# Patient Record
Sex: Female | Born: 1995 | Race: White | Hispanic: No | Marital: Married | Smoking: Never smoker
Health system: Southern US, Community
[De-identification: ages and names within clinical notes are randomized; demographics above are authoritative.]

## PROBLEM LIST (undated history)

## (undated) DIAGNOSIS — C029 Malignant neoplasm of tongue, unspecified: Secondary | ICD-10-CM

## (undated) DIAGNOSIS — Z309 Encounter for contraceptive management, unspecified: Secondary | ICD-10-CM

## (undated) DIAGNOSIS — Z789 Other specified health status: Secondary | ICD-10-CM

## (undated) DIAGNOSIS — B001 Herpesviral vesicular dermatitis: Principal | ICD-10-CM

## (undated) HISTORY — PX: NO PAST SURGERIES: SHX2092

## (undated) HISTORY — DX: Encounter for contraceptive management, unspecified: Z30.9

## (undated) HISTORY — DX: Herpesviral vesicular dermatitis: B00.1

---
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
## 2014-03-29 ENCOUNTER — Other Ambulatory Visit: Payer: Self-pay | Admitting: Adult Health

## 2014-03-29 ENCOUNTER — Telehealth: Payer: Self-pay | Admitting: *Deleted

## 2014-03-29 NOTE — Telephone Encounter (Signed)
Pt Mom came into office today and stated was told to come into office this am and pick up samples for Minastrin Fe. Gave 1 box of sample of Minastren FE to pt mom. Pt to call office for her P & P.

## 2014-04-21 ENCOUNTER — Encounter (INDEPENDENT_AMBULATORY_CARE_PROVIDER_SITE_OTHER): Payer: Self-pay | Admitting: *Deleted

## 2014-05-26 ENCOUNTER — Ambulatory Visit (INDEPENDENT_AMBULATORY_CARE_PROVIDER_SITE_OTHER): Payer: Self-pay | Admitting: Internal Medicine

## 2014-05-27 ENCOUNTER — Ambulatory Visit (INDEPENDENT_AMBULATORY_CARE_PROVIDER_SITE_OTHER): Payer: 59 | Admitting: Internal Medicine

## 2014-05-27 ENCOUNTER — Encounter (INDEPENDENT_AMBULATORY_CARE_PROVIDER_SITE_OTHER): Payer: Self-pay | Admitting: *Deleted

## 2014-05-27 ENCOUNTER — Encounter (INDEPENDENT_AMBULATORY_CARE_PROVIDER_SITE_OTHER): Payer: Self-pay | Admitting: Internal Medicine

## 2014-05-27 ENCOUNTER — Other Ambulatory Visit (INDEPENDENT_AMBULATORY_CARE_PROVIDER_SITE_OTHER): Payer: Self-pay | Admitting: *Deleted

## 2014-05-27 VITALS — BP 138/62 | HR 84 | Temp 97.4°F | Ht 68.0 in | Wt 150.6 lb

## 2014-05-27 DIAGNOSIS — K625 Hemorrhage of anus and rectum: Secondary | ICD-10-CM

## 2014-05-27 NOTE — Patient Instructions (Signed)
Colonoscopy.  The risks and benefits such as perforation, bleeding, and infection were reviewed with the patient and is agreeable. 

## 2014-05-27 NOTE — Progress Notes (Signed)
   Subjective:    Patient ID: Rebecca Valdez, female    DOB: 12-08-1995, 19 y.o.   MRN: [id number]  HPI Referred to our office by Dr. Wende Neighbors for diarrhea/ blood in stool. She tells me she had blood in her stool over Christmas break. She says she had diarrhea and then she felt constipated. She went back to the [place] and she saw blood. Just one incidence. She has a picture which show blood in the commode..  She also tells me she had diarrhea in July and her stool studies were negative. 10/19/2013 Stool studies in July negative which included C-diff. Stool studies obtained for diarrhea x 1 week. Saw Dr. Nevada Crane for this.  She says now she is okay. She has one stool a day. No blood since December. Appetite is good. No weight loss.  She usually has a BM x 1 a day. No family hx of Crohn's disease or UC    Review of Systems  Mother and father alive in good health One sister in good health.     Past Medical History  Diagnosis Date  . 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 Contraceptive management 05/01/2013    Past Surgical History  Procedure Laterality Date  . No past surgeries      No Known Allergies  Current Outpatient Prescriptions on File Prior to Visit  Medication Sig Dispense Refill  . MICROGESTIN FE 1/20 1-20 MG-MCG tablet TAKE ONE TABLET BY MOUTH ONCE DAILY 28 tablet 6   No current facility-administered medications on file prior to visit.     Objective:   Physical Exam Blood pressure 138/62, pulse 84, temperature 97.4 F (36.3 C), height 5\' 8"  (1.727 m), weight 150 lb 9.6 oz (68.312 kg). Alert and oriented. Skin warm and dry. Oral mucosa is moist.   . Sclera anicteric, conjunctivae is pink. Thyroid not enlarged. No cervical lymphadenopathy. Lungs clear. Heart regular rate and rhythm.  Abdomen is soft. Bowel sounds are positive. No hepatomegaly. No abdominal masses felt. No tenderness.  No edema to lower extremities.          Assessment & Plan:  Rectal bleeding resolved. I  discussed with Dr. Laural Golden. ? Colitis. Sigmoidoscopy with sedation.

## 2014-05-28 ENCOUNTER — Encounter (INDEPENDENT_AMBULATORY_CARE_PROVIDER_SITE_OTHER): Payer: Self-pay

## 2014-05-31 ENCOUNTER — Telehealth (INDEPENDENT_AMBULATORY_CARE_PROVIDER_SITE_OTHER): Payer: Self-pay | Admitting: *Deleted

## 2014-05-31 NOTE — Telephone Encounter (Signed)
Spoke to patient's mother and they have decided to cancel Flex Sig and wait until she has another episode of bleeding, I explained to Ms Rebecca Valdez that there wasn't a guarantee that she could get on for the FS while she was having the bleeding and mom stated Keiran had only had 2 episodes within a year

## 2014-05-31 NOTE — Telephone Encounter (Signed)
noted 

## 2014-06-11 ENCOUNTER — Encounter (HOSPITAL_COMMUNITY): Admission: RE | Payer: Self-pay | Source: Ambulatory Visit

## 2014-06-11 ENCOUNTER — Ambulatory Visit (HOSPITAL_COMMUNITY): Admission: RE | Admit: 2014-06-11 | Payer: 59 | Source: Ambulatory Visit | Admitting: Internal Medicine

## 2014-06-11 SURGERY — SIGMOIDOSCOPY, FLEXIBLE
Anesthesia: Moderate Sedation

## 2014-10-21 ENCOUNTER — Other Ambulatory Visit: Payer: Self-pay | Admitting: Adult Health

## 2015-09-11 ENCOUNTER — Other Ambulatory Visit: Payer: Self-pay | Admitting: Adult Health

## 2015-11-25 ENCOUNTER — Other Ambulatory Visit: Payer: Self-pay | Admitting: Adult Health

## 2016-01-09 ENCOUNTER — Ambulatory Visit (INDEPENDENT_AMBULATORY_CARE_PROVIDER_SITE_OTHER): Payer: BLUE CROSS/BLUE SHIELD | Admitting: Adult Health

## 2016-01-09 ENCOUNTER — Encounter: Payer: Self-pay | Admitting: Adult Health

## 2016-01-09 VITALS — BP 110/68 | HR 76 | Ht 68.0 in | Wt 149.0 lb

## 2016-01-09 DIAGNOSIS — Z3041 Encounter for surveillance of contraceptive pills: Secondary | ICD-10-CM

## 2016-01-09 MED ORDER — NORETHIN ACE-ETH ESTRAD-FE 1-20 MG-MCG PO TABS: 1.0000 | ORAL_TABLET | Freq: Every day | ORAL | 11 refills | Status: DC

## 2016-01-09 NOTE — Patient Instructions (Signed)
Continue OCs,return in 3 months for pap and physical

## 2016-01-09 NOTE — Progress Notes (Signed)
Subjective:     Patient ID: Rebecca Valdez, female   DOB: Aug 02, 1995, 20 y.o.   MRN: [id number]  HPI Rebecca Valdez is a 20 year old white female in to get refills on Junel 1-20, she is happy with them, no complaints.  Review of Systems Patient denies any headaches, hearing loss, fatigue, blurred vision, shortness of breath, chest pain, abdominal pain, problems with bowel movements, urination, or intercourse. No joint pain or mood swings. Reviewed past medical,surgical, social and family history. Reviewed medications and allergies.     Objective:   Physical Exam BP 110/68 (BP Location: Left Arm, Patient Position: Sitting, Cuff Size: Normal)   Pulse 76   Ht 5\' 8"  (1.727 m)   Wt 149 lb (67.6 kg)   LMP 12/12/2015 (Approximate)   BMI 22.66 kg/m  Skin warm and dry.  Lungs: clear to ausculation bilaterally. Cardiovascular: regular rate and rhythm.   PHQ 2 score 0.  Assessment:     1. Encounter for surveillance of contraceptive pills       Plan:     Refilled Junel 1-20 x 1 year, take 1 daily Return in 3 months for pap and physical

## 2016-04-24 ENCOUNTER — Other Ambulatory Visit (HOSPITAL_COMMUNITY)
Admission: RE | Admit: 2016-04-24 | Discharge: 2016-04-24 | Disposition: A | Discharge status: Home / Self Care | Payer: BC Managed Care – PPO | Source: Ambulatory Visit | Attending: Adult Health | Admitting: Adult Health

## 2016-04-24 ENCOUNTER — Ambulatory Visit (INDEPENDENT_AMBULATORY_CARE_PROVIDER_SITE_OTHER): Payer: BC Managed Care – PPO | Admitting: Adult Health

## 2016-04-24 ENCOUNTER — Encounter: Payer: Self-pay | Admitting: Adult Health

## 2016-04-24 VITALS — BP 131/76 | HR 86 | Ht 66.0 in | Wt 148.0 lb

## 2016-04-24 DIAGNOSIS — Z3041 Encounter for surveillance of contraceptive pills: Secondary | ICD-10-CM

## 2016-04-24 DIAGNOSIS — Z113 Encounter for screening for infections with a predominantly sexual mode of transmission: Secondary | ICD-10-CM | POA: Insufficient documentation

## 2016-04-24 DIAGNOSIS — Z01419 Encounter for gynecological examination (general) (routine) without abnormal findings: Secondary | ICD-10-CM

## 2016-04-24 MED ORDER — NORETHIN ACE-ETH ESTRAD-FE 1-20 MG-MCG PO TABS: 1.0000 | ORAL_TABLET | Freq: Every day | ORAL | 11 refills | Status: DC

## 2016-04-24 NOTE — Progress Notes (Signed)
Patient ID: Rebecca Valdez, female   DOB: July 17, 1995, 21 y.o.   MRN: [id number] History of Present Illness:  Rebecca Valdez is a 21 year old white female in for a well woman gyn exam and her first pap.She is happy with her OCs.  Current Medications, Allergies, Past Medical History, Past Surgical History, Family History and Social History were reviewed in [employer] record.     Review of Systems: Patient denies any headaches, hearing loss, fatigue, blurred vision, shortness of breath, chest pain, abdominal pain, problems with bowel movements, urination, or intercourse. No joint pain or mood swings.    Physical Exam:BP 131/76 (BP Location: Left Arm, Patient Position: Sitting, Cuff Size: Normal)   Pulse 86   Ht 5\' 6"  (1.676 m)   Wt 148 lb (67.1 kg)   BMI 23.89 kg/m  General:  Well developed, well nourished, no acute distress Skin:  Warm and dry Neck:  Midline trachea, normal thyroid, good ROM, no lymphadenopathy Lungs; Clear to auscultation bilaterally Breast:  No dominant palpable mass, retraction, or nipple discharge Cardiovascular: Regular rate and rhythm Abdomen:  Soft, non tender, no hepatosplenomegaly Pelvic:  External genitalia is normal in appearance, no lesions.  The vagina is normal in appearance. Urethra has no lesions or masses. The cervix is smooth and nulliparous, pap with GC/CHL performed.  Uterus is felt to be normal size, shape, and contour.  No adnexal masses or tenderness noted.Bladder is non tender, no masses felt. Extremities/musculoskeletal:  No swelling or varicosities noted, no clubbing or cyanosis Psych:  No mood changes, alert and cooperative,seems happy PHQ 2 score 0.  Impression: 1. Encounter for gynecological examination with Papanicolaou smear of cervix   2. Encounter for surveillance of contraceptive pills       Plan: Meds ordered this encounter  Medications  . norethindrone-ethinyl estradiol (MICROGESTIN FE 1/20) 1-20 MG-MCG tablet   Sig: Take 1 tablet by mouth daily.    Dispense:  28 tablet    Refill:  11    Order Specific Question:   Supervising Provider    Answer:   Florian Buff [[id number]]  Physical in 1 year Pap in 3 if normal

## 2016-04-24 NOTE — Patient Instructions (Signed)
Physical in 1 year 

## 2016-04-26 LAB — CYTOLOGY - PAP
Chlamydia: NEGATIVE
Diagnosis: NEGATIVE
NEISSERIA GONORRHEA: NEGATIVE

## 2017-05-02 ENCOUNTER — Other Ambulatory Visit: Payer: Self-pay | Admitting: Adult Health

## 2018-05-01 ENCOUNTER — Other Ambulatory Visit: Payer: Self-pay | Admitting: Adult Health

## 2018-08-19 ENCOUNTER — Other Ambulatory Visit: Payer: Self-pay | Admitting: Adult Health

## 2018-09-30 ENCOUNTER — Other Ambulatory Visit: Payer: BC Managed Care – PPO

## 2018-09-30 ENCOUNTER — Other Ambulatory Visit: Payer: Self-pay

## 2018-09-30 DIAGNOSIS — Z20822 Contact with and (suspected) exposure to covid-19: Secondary | ICD-10-CM

## 2018-10-03 LAB — NOVEL CORONAVIRUS, NAA: SARS-CoV-2, NAA: NOT DETECTED

## 2018-12-11 ENCOUNTER — Other Ambulatory Visit: Payer: Self-pay | Admitting: Adult Health

## 2019-03-12 ENCOUNTER — Other Ambulatory Visit: Payer: Self-pay

## 2019-03-12 ENCOUNTER — Encounter: Payer: Self-pay | Admitting: Adult Health

## 2019-03-12 ENCOUNTER — Ambulatory Visit (INDEPENDENT_AMBULATORY_CARE_PROVIDER_SITE_OTHER): Payer: BC Managed Care – PPO | Admitting: Adult Health

## 2019-03-12 VITALS — BP 114/70 | HR 73 | Ht 67.0 in | Wt 154.0 lb

## 2019-03-12 DIAGNOSIS — Z975 Presence of (intrauterine) contraceptive device: Secondary | ICD-10-CM | POA: Insufficient documentation

## 2019-03-12 DIAGNOSIS — Z113 Encounter for screening for infections with a predominantly sexual mode of transmission: Secondary | ICD-10-CM | POA: Insufficient documentation

## 2019-03-12 DIAGNOSIS — Z3041 Encounter for surveillance of contraceptive pills: Secondary | ICD-10-CM

## 2019-03-12 DIAGNOSIS — N941 Unspecified dyspareunia: Secondary | ICD-10-CM | POA: Insufficient documentation

## 2019-03-12 MED ORDER — NORETHIN ACE-ETH ESTRAD-FE 1-20 MG-MCG PO TABS: 1.0000 | ORAL_TABLET | Freq: Every day | ORAL | 4 refills | Status: DC

## 2019-03-12 NOTE — Progress Notes (Signed)
  Subjective:     Patient ID: Rebecca Valdez, female   DOB: 06-13-1995, 23 y.o.   MRN: [id number]  HPI Rebecca Valdez is a 23 year old white female, married, in for refills on OCs, ran out. PCP is Dr Nevada Crane.   Review of Systems  Skin warm and dry. Neck: mid line trachea, normal thyroid, good ROM, no lymphadenopathy noted. Lungs: clear to ausculation bilaterally. Cardiovascular: regular rate and rhythm. Should have started OCs Sunday   Reviewed past medical,surgical, social and family history. Reviewed medications and allergies.     Objective:   Physical Exam BP 114/70 (BP Location: Left Arm, Patient Position: Sitting, Cuff Size: Normal)   Pulse 73   Ht 5\' 7"  (1.702 m)   Wt 154 lb (69.9 kg)   BMI 24.12 kg/m   Skin warm and dry. Neck: mid line trachea, normal thyroid, good ROM, no lymphadenopathy noted. Lungs: clear to ausculation bilaterally. Cardiovascular: regular rate and rhythm.Exam by Weyman Croon FNP student. Fall risk is low PHQ 2 score 0.     Assessment:     1. Encounter for surveillance of contraceptive pills       Plan:    Start OCs today and use back up for 1 pack, like condoms  Meds ordered this encounter  Medications  . norethindrone-ethinyl estradiol (MICROGESTIN FE 1/20) 1-20 MG-MCG tablet    Sig: Take 1 tablet by mouth daily.    Dispense:  84 tablet    Refill:  4    Order Specific Question:   Supervising Provider    Answer:   Tania Ade H [[id number]]  Pap and physical  04/27/19

## 2019-05-01 ENCOUNTER — Other Ambulatory Visit: Payer: BC Managed Care – PPO | Admitting: Adult Health

## 2019-05-28 ENCOUNTER — Telehealth: Payer: Self-pay | Admitting: *Deleted

## 2019-05-28 MED ORDER — NORETHIN ACE-ETH ESTRAD-FE 1-20 MG-MCG PO TABS: 1.0000 | ORAL_TABLET | Freq: Every day | ORAL | 4 refills | Status: DC

## 2019-05-28 NOTE — Telephone Encounter (Signed)
Patient left message wanting refill on BCP. Patient does have pap schedule 06/15/2018.

## 2019-05-28 NOTE — Addendum Note (Signed)
Addended by: Derrek Monaco A on: 05/28/2019 05:12 PM   Modules accepted: Orders

## 2019-05-28 NOTE — Telephone Encounter (Signed)
Refilled OCs 

## 2019-06-15 ENCOUNTER — Other Ambulatory Visit (HOSPITAL_COMMUNITY)
Admission: RE | Admit: 2019-06-15 | Discharge: 2019-06-15 | Disposition: A | Discharge status: Home / Self Care | Payer: BC Managed Care – PPO | Source: Ambulatory Visit | Attending: Adult Health | Admitting: Adult Health

## 2019-06-15 ENCOUNTER — Other Ambulatory Visit: Payer: Self-pay

## 2019-06-15 ENCOUNTER — Encounter: Payer: Self-pay | Admitting: Adult Health

## 2019-06-15 ENCOUNTER — Ambulatory Visit (INDEPENDENT_AMBULATORY_CARE_PROVIDER_SITE_OTHER): Payer: BC Managed Care – PPO | Admitting: Adult Health

## 2019-06-15 VITALS — BP 118/73 | HR 74 | Ht 68.0 in | Wt 156.0 lb

## 2019-06-15 DIAGNOSIS — Z3041 Encounter for surveillance of contraceptive pills: Secondary | ICD-10-CM | POA: Diagnosis not present

## 2019-06-15 DIAGNOSIS — Z01419 Encounter for gynecological examination (general) (routine) without abnormal findings: Secondary | ICD-10-CM | POA: Diagnosis not present

## 2019-06-15 MED ORDER — NORETHIN ACE-ETH ESTRAD-FE 1-20 MG-MCG PO TABS: 1.0000 | ORAL_TABLET | Freq: Every day | ORAL | 4 refills | Status: DC

## 2019-06-15 NOTE — Progress Notes (Signed)
Patient ID: Rebecca Valdez, female   DOB: 1995-11-25, 24 y.o.   MRN: [id number] History of Present Illness: Lamara is a 24 year old white female,married, G0P0, in for a well woman gyn exam and pap. PCP is Dr Nevada Crane.   Current Medications, Allergies, Past Medical History, Past Surgical History, Family History and Social History were reviewed in [employer] record.     Review of Systems:  Patient denies any headaches, hearing loss, fatigue, blurred vision, shortness of breath, chest pain, abdominal pain, problems with bowel movements, urination, or intercourse. No joint pain or mood swings. She is happy with her OCs.   Physical Exam:BP 118/73 (BP Location: Left Arm, Patient Position: Sitting, Cuff Size: Normal)   Pulse 74   Ht 5\' 8"  (1.727 m)   Wt 156 lb (70.8 kg)   LMP 05/25/2019 (Exact Date)   BMI 23.72 kg/m  General:  Well developed, well nourished, no acute distress Skin:  Warm and dry Neck:  Midline trachea, normal thyroid, good ROM, no lymphadenopathy Lungs; Clear to auscultation bilaterally Breast:  No dominant palpable mass, retraction, or nipple discharge Cardiovascular: Regular rate and rhythm Abdomen:  Soft, non tender, no hepatosplenomegaly Pelvic:  External genitalia is normal in appearance, no lesions.  The vagina is normal in appearance. Urethra has no lesions or masses. The cervix is smooth and nulliparous, pap with HPV reflex performed.  Uterus is felt to be normal size, shape, and contour.  No adnexal masses or tenderness noted.Bladder is non tender, no masses felt. Extremities/musculoskeletal:  No swelling or varicosities noted, no clubbing or cyanosis Psych:  No mood changes, alert and cooperative,seems happy Fall risk is low PHQ 2 score is 0.  Examination chaperoned by Rolena Infante LPN  Impression and plan: 1. Encounter for gynecological examination with Papanicolaou smear of cervix Pap sent Physical in 1 year Pap in 3 if normal   2.  Encounter for surveillance of contraceptive pills Will continue OCs Meds ordered this encounter  Medications  . norethindrone-ethinyl estradiol (MICROGESTIN FE 1/20) 1-20 MG-MCG tablet    Sig: Take 1 tablet by mouth daily.    Dispense:  84 tablet    Refill:  4    Order Specific Question:   Supervising Provider    Answer:   Tania Ade H [[id number]]

## 2019-06-17 LAB — CYTOLOGY - PAP: Diagnosis: NEGATIVE

## 2020-04-02 NOTE — L&D Delivery Note (Signed)
Delivery Note Patient was admitted with PPROM.  She was started on pitocin and progressed along a normal labor curve.  She pushed well without epidural for 50 minutes.  At 5:54 PM a viable female was delivered via Vaginal, Spontaneous (Presentation:  ROA ).  APGAR: 8, 9; weight  2702 gm (5lb 15.3 oz).   Placenta status: Spontaneous, Intact.  Cord: 3 vessels with the following complications: None.  Upon examination of the placenta, there was a 3 cm tan firm nodule at the placental edge.  Placenta was sent to pathology for evaluation. Cord pH: n/a  Anesthesia: 1% lidocaine with epi Episiotomy: None Lacerations: 2nd degree Suture Repair: 2.0 vicryl rapide Est. Blood Loss (mL):  150 mL  Mom to postpartum.  Baby to Couplet care / Skin to Skin.  [place] 12/07/2020, 6:33 PM

## 2020-06-02 LAB — HEPATITIS C ANTIBODY: HCV Ab: NEGATIVE

## 2020-06-02 LAB — OB RESULTS CONSOLE HEPATITIS B SURFACE ANTIGEN: Hepatitis B Surface Ag: NEGATIVE

## 2020-06-02 LAB — OB RESULTS CONSOLE HIV ANTIBODY (ROUTINE TESTING): HIV: NONREACTIVE

## 2020-06-02 LAB — OB RESULTS CONSOLE RUBELLA ANTIBODY, IGM: Rubella: IMMUNE

## 2020-10-04 LAB — OB RESULTS CONSOLE HIV ANTIBODY (ROUTINE TESTING): HIV: NONREACTIVE

## 2020-10-06 ENCOUNTER — Encounter: Payer: BC Managed Care – PPO | Admitting: Physician Assistant

## 2020-10-06 NOTE — Progress Notes (Signed)
Patient is covid 19 positive but is pregnant. Deferred to PCP or OB. Advised if unable to get with them please proceed to UC. She agrees.

## 2020-12-07 ENCOUNTER — Inpatient Hospital Stay (HOSPITAL_COMMUNITY)
Admission: AD | Admit: 2020-12-07 | Discharge: 2020-12-09 | DRG: 807 | Disposition: A | Discharge status: Home / Self Care | Payer: 59 | Attending: Obstetrics | Admitting: Obstetrics

## 2020-12-07 ENCOUNTER — Other Ambulatory Visit: Payer: Self-pay

## 2020-12-07 ENCOUNTER — Encounter (HOSPITAL_COMMUNITY): Payer: Self-pay | Admitting: Obstetrics and Gynecology

## 2020-12-07 DIAGNOSIS — O42913 Preterm premature rupture of membranes, unspecified as to length of time between rupture and onset of labor, third trimester: Principal | ICD-10-CM | POA: Diagnosis present

## 2020-12-07 DIAGNOSIS — Z20822 Contact with and (suspected) exposure to covid-19: Secondary | ICD-10-CM | POA: Diagnosis present

## 2020-12-07 DIAGNOSIS — Z3A36 36 weeks gestation of pregnancy: Secondary | ICD-10-CM

## 2020-12-07 HISTORY — DX: Other specified health status: Z78.9

## 2020-12-07 LAB — RPR: RPR Ser Ql: NONREACTIVE

## 2020-12-07 LAB — URINALYSIS, ROUTINE W REFLEX MICROSCOPIC
Bilirubin Urine: NEGATIVE
Glucose, UA: NEGATIVE mg/dL
Hgb urine dipstick: NEGATIVE
Ketones, ur: NEGATIVE mg/dL
Leukocytes,Ua: NEGATIVE
Nitrite: NEGATIVE
Protein, ur: NEGATIVE mg/dL
Specific Gravity, Urine: 1.009 (ref 1.005–1.030)
pH: 6 (ref 5.0–8.0)

## 2020-12-07 LAB — RESP PANEL BY RT-PCR (FLU A&B, COVID) ARPGX2
Influenza A by PCR: NEGATIVE
Influenza B by PCR: NEGATIVE
SARS Coronavirus 2 by RT PCR: NEGATIVE

## 2020-12-07 LAB — CBC
HCT: 35.1 % — ABNORMAL LOW (ref 36.0–46.0)
Hemoglobin: 12 g/dL (ref 12.0–15.0)
MCH: 31.2 pg (ref 26.0–34.0)
MCHC: 34.2 g/dL (ref 30.0–36.0)
MCV: 91.2 fL (ref 80.0–100.0)
Platelets: 253 10*3/uL (ref 150–400)
RBC: 3.85 MIL/uL — ABNORMAL LOW (ref 3.87–5.11)
RDW: 13.1 % (ref 11.5–15.5)
WBC: 9.1 10*3/uL (ref 4.0–10.5)
nRBC: 0 % (ref 0.0–0.2)

## 2020-12-07 LAB — GROUP B STREP BY PCR: Group B strep by PCR: NEGATIVE

## 2020-12-07 LAB — POCT FERN TEST: POCT Fern Test: POSITIVE

## 2020-12-07 LAB — TYPE AND SCREEN
ABO/RH(D): O POS
Antibody Screen: NEGATIVE

## 2020-12-07 LAB — OB RESULTS CONSOLE GBS: GBS: NEGATIVE

## 2020-12-07 MED ORDER — SODIUM CHLORIDE 0.9 % IV SOLN
5.0000 10*6.[IU] | Freq: Once | INTRAVENOUS | Status: AC
  Administered 2020-12-07: 5 10*6.[IU] via INTRAVENOUS
  Filled 2020-12-07: qty 5

## 2020-12-07 MED ORDER — OXYTOCIN-SODIUM CHLORIDE 30-0.9 UT/500ML-% IV SOLN
1.0000 m[IU]/min | INTRAVENOUS | Status: DC
  Administered 2020-12-07: 2 m[IU]/min via INTRAVENOUS

## 2020-12-07 MED ORDER — PENICILLIN G POT IN DEXTROSE 60000 UNIT/ML IV SOLN
3.0000 10*6.[IU] | INTRAVENOUS | Status: DC
  Administered 2020-12-07: 3 10*6.[IU] via INTRAVENOUS
  Filled 2020-12-07: qty 50

## 2020-12-07 MED ORDER — EPHEDRINE 5 MG/ML INJ: 10.0000 mg | INTRAVENOUS | Status: DC | PRN

## 2020-12-07 MED ORDER — SIMETHICONE 80 MG PO CHEW: 80.0000 mg | CHEWABLE_TABLET | ORAL | Status: DC | PRN

## 2020-12-07 MED ORDER — COCONUT OIL OIL
1.0000 "application " | TOPICAL_OIL | Status: DC | PRN
  Administered 2020-12-09: 1 via TOPICAL

## 2020-12-07 MED ORDER — FENTANYL-BUPIVACAINE-NACL 0.5-0.125-0.9 MG/250ML-% EP SOLN: 12.0000 mL/h | EPIDURAL | Status: DC | PRN

## 2020-12-07 MED ORDER — TERBUTALINE SULFATE 1 MG/ML IJ SOLN: 0.2500 mg | Freq: Once | INTRAMUSCULAR | Status: DC | PRN

## 2020-12-07 MED ORDER — OXYCODONE HCL 5 MG PO TABS: 10.0000 mg | ORAL_TABLET | ORAL | Status: DC | PRN

## 2020-12-07 MED ORDER — LIDOCAINE HCL (PF) 1 % IJ SOLN
30.0000 mL | INTRAMUSCULAR | Status: AC | PRN
  Administered 2020-12-07: 30 mL via SUBCUTANEOUS
  Filled 2020-12-07: qty 30

## 2020-12-07 MED ORDER — DIBUCAINE (PERIANAL) 1 % EX OINT: 1.0000 "application " | TOPICAL_OINTMENT | CUTANEOUS | Status: DC | PRN

## 2020-12-07 MED ORDER — ACETAMINOPHEN 325 MG PO TABS: 650.0000 mg | ORAL_TABLET | ORAL | Status: DC | PRN

## 2020-12-07 MED ORDER — SOD CITRATE-CITRIC ACID 500-334 MG/5ML PO SOLN: 30.0000 mL | ORAL | Status: DC | PRN

## 2020-12-07 MED ORDER — WITCH HAZEL-GLYCERIN EX PADS
1.0000 "application " | MEDICATED_PAD | CUTANEOUS | Status: DC | PRN
  Administered 2020-12-07: 1 via TOPICAL

## 2020-12-07 MED ORDER — ONDANSETRON HCL 4 MG PO TABS: 4.0000 mg | ORAL_TABLET | ORAL | Status: DC | PRN

## 2020-12-07 MED ORDER — OXYCODONE-ACETAMINOPHEN 5-325 MG PO TABS: 2.0000 | ORAL_TABLET | ORAL | Status: DC | PRN

## 2020-12-07 MED ORDER — TETANUS-DIPHTH-ACELL PERTUSSIS 5-2.5-18.5 LF-MCG/0.5 IM SUSY: 0.5000 mL | PREFILLED_SYRINGE | Freq: Once | INTRAMUSCULAR | Status: DC

## 2020-12-07 MED ORDER — LACTATED RINGERS IV SOLN: 500.0000 mL | Freq: Once | INTRAVENOUS | Status: DC

## 2020-12-07 MED ORDER — PHENYLEPHRINE 40 MCG/ML (10ML) SYRINGE FOR IV PUSH (FOR BLOOD PRESSURE SUPPORT): 80.0000 ug | PREFILLED_SYRINGE | INTRAVENOUS | Status: DC | PRN

## 2020-12-07 MED ORDER — BENZOCAINE-MENTHOL 20-0.5 % EX AERO
1.0000 "application " | INHALATION_SPRAY | CUTANEOUS | Status: DC | PRN
  Administered 2020-12-07: 1 via TOPICAL
  Filled 2020-12-07: qty 56

## 2020-12-07 MED ORDER — OXYCODONE HCL 5 MG PO TABS: 5.0000 mg | ORAL_TABLET | ORAL | Status: DC | PRN

## 2020-12-07 MED ORDER — DIPHENHYDRAMINE HCL 25 MG PO CAPS: 25.0000 mg | ORAL_CAPSULE | Freq: Four times a day (QID) | ORAL | Status: DC | PRN

## 2020-12-07 MED ORDER — OXYCODONE-ACETAMINOPHEN 5-325 MG PO TABS: 1.0000 | ORAL_TABLET | ORAL | Status: DC | PRN

## 2020-12-07 MED ORDER — DIPHENHYDRAMINE HCL 50 MG/ML IJ SOLN
12.5000 mg | INTRAMUSCULAR | Status: DC | PRN
Start: 2020-12-07 — End: 2020-12-07

## 2020-12-07 MED ORDER — OXYTOCIN BOLUS FROM INFUSION: 333.0000 mL | Freq: Once | INTRAVENOUS | Status: DC

## 2020-12-07 MED ORDER — LACTATED RINGERS IV SOLN: INTRAVENOUS | Status: DC

## 2020-12-07 MED ORDER — OXYTOCIN-SODIUM CHLORIDE 30-0.9 UT/500ML-% IV SOLN
2.5000 [IU]/h | INTRAVENOUS | Status: DC
  Filled 2020-12-07: qty 500

## 2020-12-07 MED ORDER — FENTANYL CITRATE (PF) 100 MCG/2ML IJ SOLN
100.0000 ug | INTRAMUSCULAR | Status: DC | PRN
  Administered 2020-12-07 (×2): 100 ug via INTRAVENOUS
  Filled 2020-12-07 (×2): qty 2

## 2020-12-07 MED ORDER — ONDANSETRON HCL 4 MG/2ML IJ SOLN: 4.0000 mg | Freq: Four times a day (QID) | INTRAMUSCULAR | Status: DC | PRN

## 2020-12-07 MED ORDER — FLEET ENEMA 7-19 GM/118ML RE ENEM: 1.0000 | ENEMA | RECTAL | Status: DC | PRN

## 2020-12-07 MED ORDER — ONDANSETRON HCL 4 MG/2ML IJ SOLN: 4.0000 mg | INTRAMUSCULAR | Status: DC | PRN

## 2020-12-07 MED ORDER — PRENATAL MULTIVITAMIN CH
1.0000 | ORAL_TABLET | Freq: Every day | ORAL | Status: DC
  Administered 2020-12-08 – 2020-12-09 (×2): 1 via ORAL
  Filled 2020-12-07 (×2): qty 1

## 2020-12-07 MED ORDER — SENNOSIDES-DOCUSATE SODIUM 8.6-50 MG PO TABS
2.0000 | ORAL_TABLET | ORAL | Status: DC
  Administered 2020-12-07 – 2020-12-09 (×2): 2 via ORAL
  Filled 2020-12-07 (×3): qty 2

## 2020-12-07 MED ORDER — IBUPROFEN 600 MG PO TABS
600.0000 mg | ORAL_TABLET | Freq: Four times a day (QID) | ORAL | Status: DC
  Administered 2020-12-07 – 2020-12-09 (×7): 600 mg via ORAL
  Filled 2020-12-07 (×7): qty 1

## 2020-12-07 MED ORDER — LACTATED RINGERS IV SOLN: 500.0000 mL | INTRAVENOUS | Status: DC | PRN

## 2020-12-07 NOTE — Care Management Note (Signed)
Patient was able to ambulate to the restroom and void before going upstairs to her room

## 2020-12-07 NOTE — Lactation Note (Signed)
This note was copied from a baby's chart. Lactation Consultation Note  Patient Name: Rebecca Valdez [id number] Date: 12/07/2020 Reason for consult: Initial assessment;Late-preterm 34-36.6wks Age:25 years P1, LPTI . Infant was cuing to breastfeed when [facility] entered the room, per mom, infant  has not sustaining latch. Mom made multiple attempts to latch infant, mom  fitted with 16 mm NS and re-latched infant on her right breast using the football hold position, infant sustained latch and breastfeed for 17 minutes.  Mom hand expressed and use hand pump, breast flange was resized with 21 mm breast flange and mom expressed 7 mls of colostrum that was spoon feed to infant. Afterwards mom did skin to skin with infant and infant appeared content after the feeding. LC dicussed infant's input and output. Mom made aware of O/P services, breastfeeding support groups, community resources, and our phone # for post-discharge questions.   Mom understand that infant feeding plan may change based on infant's feeding behaviors.  Moms plan: 1- Mom will breastfeed infant according LPTI feeding policy ( green sheet given and explained). 2- Mom will apply 16 mm NS prior to latching infant at the breast and ask for breastfeeding assistance if needed. 3- Mom will supplement infant after latching infant at the breast with any EBM that is hand expressed or pumped from hand pump. 4- Mom plans to use her personal DEBP in morning  that she brought from home and mom  knows to pump every 3 hours for 15 minutes on initial setting.  Maternal Data Has patient been taught Hand Expression?: Yes Does the patient have breastfeeding experience prior to this delivery?: No  Feeding Mother's Current Feeding Choice: Breast Milk  LATCH Score Latch: Grasps breast easily, tongue down, lips flanged, rhythmical sucking. (Infant sustained latch with 16 mm NS.)  Audible Swallowing: Spontaneous and intermittent  Type of Nipple: Everted at rest  and after stimulation  Comfort (Breast/Nipple): Soft / non-tender  Hold (Positioning): Assistance needed to correctly position infant at breast and maintain latch.  LATCH Score: 9   Lactation Tools Discussed/Used Tools: Pump Breast pump type: Manual Pump Education: Setup, frequency, and cleaning;Milk Storage Reason for Pumping: mom was given one by RN , LC refitted mom with 21 mm breast flange Pumping frequency: Mom will use hand pump tonight and  in the  morning she will try her personal DEBP, mom knows to pump every 3 hours for 15 minutes. Pumped volume: 4 mL  Interventions Interventions: Breast feeding basics reviewed;Assisted with latch;Skin to skin;Support pillows;Adjust position;Breast compression;Breast massage;Position options;Hand pump;Expressed milk;Hand express;Education  Discharge Pump: Manual;Personal WIC Program: No  Consult Status Consult Status: Follow-up Date: 12/08/20 Follow-up type: In-patient    Vicente Serene 12/07/2020, 11:38 PM

## 2020-12-07 NOTE — Progress Notes (Signed)
Patient seen and examined.  Painful with contractions  BP (!) 120/47   Pulse 62   Temp 97.6 F (36.4 C) (Oral)   Resp 18   Ht '5\' 8"'$  (1.727 m)   Wt 88.9 kg   BMI 29.80 kg/m   Toco: q2 minutes EFM: 130s, moderate variability, category 1 SVE: 6/90/+1 per RN  A/P:  G1 @ 25w5dwith PPROM, now in active labor Continue pitocin augmentation Anticipate SVD

## 2020-12-07 NOTE — Lactation Note (Signed)
This note was copied from a baby's chart. Lactation Consultation Note  Patient Name: Rebecca Valdez [id number] Date: 12/07/2020 Reason for consult: L&D Initial assessment;Late-preterm 34-36.6wks Age:25 hours  L&D consult with >60 minutes old infant and P1 mother. Congratulated family on newborn.  Offered assistance with latch, laid back position, several attempts to latch unsuccessful. Short shafted nipples and dense tissue. Mother may benefit from manual pump and potentially nipple shield.  Assisted with hand expression and spoonfed ~2 mL.  Newborn back to skin to skin.   Discussed STS as ideal transition for infants after birth helping with temperature, blood sugar and comfort. Talked about primal reflexes such as rooting, hands to mouth, searching for the breast among others. Explained [place] services availability during postpartum stay. Thanked family for their time.      Maternal Data Has patient been taught Hand Expression?: Yes Does the patient have breastfeeding experience prior to this delivery?: No  Feeding Mother's Current Feeding Choice: Breast Milk  LATCH Score Latch: Repeated attempts needed to sustain latch, nipple held in mouth throughout feeding, stimulation needed to elicit sucking reflex.  Audible Swallowing: None  Type of Nipple: Everted at rest and after stimulation (very shafted, dense tissue)  Comfort (Breast/Nipple): Soft / non-tender  Hold (Positioning): Assistance needed to correctly position infant at breast and maintain latch.  LATCH Score: 6   Interventions Interventions: Breast feeding basics reviewed;Assisted with latch;Skin to skin;Hand express;Breast massage;Adjust position;Expressed milk  Discharge Pump: Personal WIC Program: No  Consult Status Consult Status: Follow-up from L&D    [place] 12/07/2020, 7:01 PM

## 2020-12-07 NOTE — Progress Notes (Signed)
Dr Rogue Bussing aware of pt's admission and status. Aware of ctx pattern, sve, SROM with cl fld, pt's elevated b/p with no other symptoms, FHR status. Will admit to [facility]

## 2020-12-07 NOTE — H&P (Signed)
25 y.o. G1P0000 @ 70w5dpresents with premature preterm rupture of membranes.  She was ruled in for rupture with a positive fern.  She is dated by an early first trimester ultrasound. Her pregnancy has been uncomplicated.  Otherwise has good fetal movement and no bleeding.  She is feeling some contractions, but they are mild and irregular.   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    Past Surgical History:  Procedure Laterality Date   NO PAST SURGERIES      OB History  Gravida Para Term Preterm AB Living  1 0 0 0 0 0  SAB IAB Ectopic Multiple Live Births  0 0 0 0 0    # Outcome Date GA Lbr Len/2nd Weight Sex Delivery Anes PTL Lv  1 Current             Social History   Socioeconomic History   Marital status: Married    Spouse name: GDonna Christen  Number of children: Not on file   Years of education: Not on file   Highest education level: Not on file  Occupational History   Not on file  Tobacco Use   Smoking status: Never    Passive exposure: Yes   Smokeless tobacco: Never  Vaping Use   Vaping Use: Never used  Substance and Sexual Activity   Alcohol use: Yes    Comment: rarely   Drug use: No   Sexual activity: Yes  Other Topics Concern   Not on file  Social History Narrative   Not on file   Social Determinants of Health   Financial Resource Strain: Not on file  Food Insecurity: Not on file  Transportation Needs: Not on file  Physical Activity: Not on file  Stress: Not on file  Social Connections: Not on file  Intimate Partner Violence: Not on file   Patient has no known allergies.    Prenatal Transfer Tool  Maternal Diabetes: No Genetic Screening: Declined Maternal Ultrasounds/Referrals: Normal Fetal Ultrasounds or other Referrals:  None Maternal Substance Abuse:  No Significant Maternal Medications:  None Significant Maternal Lab Results: Other: GBS  collected yesterday in office and pending  ABO, Rh: --/--/O POS (09/07 [id number] Antibody: NEG (09/07 0727) Rubella:  Immune RPR:   NR HBsAg:   Negative HIV:   Negative GBS: Unknown   Vitals:   12/07/20 0927 12/07/20 1048  BP:  119/63  Pulse:  84  Resp:    Temp: 98.2 F (36.8 C)      General:  NAD Abdomen:  soft, gravid, EFW 6# Ex:  no edema SVE:  2/90/0 per RN FHTs:  130s, moderate variability, + accelerations, category 1 Toco:  irregular q5-10 minutes   A/P   25y.o. G1P0000 320w5dresents with PPROM No cervical change in 6 hours.  Patient is now amenable to augmentation with pitocin GBS unknown and < 37 weeks.  Will start penicillin for gbs prophylaxis as < 37 weeks and status unknown Epidural upon maternal request Anticipate SVD  [place]

## 2020-12-08 ENCOUNTER — Encounter (HOSPITAL_COMMUNITY): Payer: Self-pay | Admitting: Obstetrics and Gynecology

## 2020-12-08 LAB — CBC
HCT: 33.2 % — ABNORMAL LOW (ref 36.0–46.0)
Hemoglobin: 11.1 g/dL — ABNORMAL LOW (ref 12.0–15.0)
MCH: 30.7 pg (ref 26.0–34.0)
MCHC: 33.4 g/dL (ref 30.0–36.0)
MCV: 92 fL (ref 80.0–100.0)
Platelets: 263 10*3/uL (ref 150–400)
RBC: 3.61 MIL/uL — ABNORMAL LOW (ref 3.87–5.11)
RDW: 13.1 % (ref 11.5–15.5)
WBC: 14.6 10*3/uL — ABNORMAL HIGH (ref 4.0–10.5)
nRBC: 0 % (ref 0.0–0.2)

## 2020-12-08 NOTE — Lactation Note (Addendum)
This note was copied from a baby's chart. Lactation Consultation Note  Patient Name: Rebecca Valdez [id number] Date: 12/08/2020 Reason for consult: Follow-up assessment;Mother's request;Primapara;1st time breastfeeding;Late-preterm 34-36.6wks;Infant < 6lbs;Hyperbilirubinemia;Other (Comment) (Double PT) Age:26 hours  Mom hand expressing and offering colostrum 4 ml on spoon. Mom supplementing with 22 cal/0z formula 4 ml with last feeding.  Infant fed recently, LC not able to see a latch at this visit.   LC did some suck training. Infant holding tongue back and assisted with chin tug to bring tongue down. Infant 3 bouts of emesis. [place] talked with Mom suspending spoon feeding of colostrum and offering colostrum with bottle.  Infant sensitive gag reflex noted with suck training. Mom still able to latch at breast as discussed with RN/ parents.   Plan 1. To feed based on cues 8- 12x 24 hr period. Mom to offer breasts as tolerated 2. Mom to bottle feeding EBM first followed by formula. Infant only tolerating 8 ml with some emesis. Mom try to advance volume slowly as tolerated with pace bottle feeding with extra slow flow nipple.  3. Pumping with personal pump q 3hrs for 15 min  All questions answered at the end of the visit.   Maternal Data    Feeding Mother's Current Feeding Choice: Breast Milk and Formula  LATCH Score                    Lactation Tools Discussed/Used Tools: Pump;Flanges Flange Size: 24 Breast pump type: Other (comment) (personal) Pump Education: Setup, frequency, and cleaning;Milk Storage Reason for Pumping: increase stimulation Pumping frequency: every 3 hrs for 15 min  Interventions Interventions: Breast feeding basics reviewed;DEBP;Education;Pace feeding  Discharge Pump: Personal  Consult Status Consult Status: Follow-up Date: 12/09/20 Follow-up type: In-patient    Roslind Michaux  Nicholson-Springer 12/08/2020, 7:10 PM

## 2020-12-08 NOTE — Progress Notes (Signed)
PPD #1 No problems, baby spitting up more than usual and on double phototherapy Afeb, VSS Fundus firm, NT at U-1 Continue routine postpartum care, will hold circ until baby improving

## 2020-12-08 NOTE — Progress Notes (Signed)
Father of baby has picture of baby on scale for birth weight measurement. This nurse edited the birth weight in delivery summary to reflect measurement in photo per parent request.

## 2020-12-09 ENCOUNTER — Ambulatory Visit: Payer: Self-pay

## 2020-12-09 LAB — SURGICAL PATHOLOGY

## 2020-12-09 MED ORDER — ACETAMINOPHEN 325 MG PO TABS: 650.0000 mg | ORAL_TABLET | Freq: Four times a day (QID) | ORAL | Status: DC | PRN

## 2020-12-09 MED ORDER — IBUPROFEN 200 MG PO TABS: 600.0000 mg | ORAL_TABLET | Freq: Four times a day (QID) | ORAL | Status: DC | PRN

## 2020-12-09 NOTE — Lactation Note (Signed)
This note was copied from a baby's chart. Lactation Consultation Note  Patient Name: Rebecca Valdez [id number] Date: 12/09/2020 Reason for consult: Follow-up assessment;Mother's request;Late-preterm 34-36.6wks;Infant weight loss;Other (Comment);Hyperbilirubinemia Age:26 hours  Mom seen by SLP following plan set up by provider. Mom states not latching infant at the breast at this time. She is pumping and offering EBM first followed by formula. Mom increasing volume of supplementation as tolerated with each feeding. Mom aware if infant not latching at breast to offer more.   Mom denied any pain with use of current flange size.  We talked about putting infant to breast as tolerated. Mom stated start again in the morning latching infant at breast.   We reviewed pace feeding. Mom stated emesis resolved largest volume with last feeding 20 ml total.   Mom encouraged to pump to maintain her milk supply q 3hrs for 15 min with dEBP.   All questions answered at the end of the visit.   Maternal Data    Feeding Mother's Current Feeding Choice: Breast Milk and Formula Nipple Type: Nfant Slow Flow (purple)  LATCH Score                    Lactation Tools Discussed/Used Tools: Pump;Flanges Breast pump type: Double-Electric Breast Pump Pump Education: Setup, frequency, and cleaning;Milk Storage Reason for Pumping: increase stimulation Pumping frequency: every 3 hrs for 75mn  Interventions Interventions: Breast feeding basics reviewed;DEBP;Education;Hand express;Expressed milk;Pace feeding  Discharge Pump: Personal  Consult Status Consult Status: Follow-up Date: 12/10/20 Follow-up type: In-patient    Koralynn Greenspan  Nicholson-Springer 12/09/2020, 9:27 PM

## 2020-12-09 NOTE — Discharge Summary (Addendum)
Postpartum Discharge Summary  Date of Service updated      Patient Name: Rebecca Valdez DOB: 11-Feb-1996 MRN: [id number]  Date of admission: 12/07/2020 Delivery date:12/07/2020  Delivering provider: Jerelyn Charles  Date of discharge: 12/09/2020  Admitting diagnosis: Indication for care in labor or delivery [O75.9] Intrauterine pregnancy: [redacted]w[redacted]d    Secondary diagnosis:  Active Problems:   Indication for care in labor or delivery  Additional problems: PPROM    Discharge diagnosis: Preterm Pregnancy Delivered                                              Post partum procedures: none Augmentation: Pitocin Complications: None  Hospital course: Onset of Labor With Vaginal Delivery      25y.o. yo G1P0101 at 317w5das admitted in Latent Labor on 12/07/2020. Patient had an uncomplicated labor course as follows:  Membrane Rupture Time/Date: 4:30 AM ,12/07/2020   Delivery Method:Vaginal, Spontaneous  Episiotomy: None  Lacerations:  1st degree  Patient had an uncomplicated postpartum course.  She is ambulating, tolerating a regular diet, passing flatus, and urinating well. Patient is discharged home in stable condition on 12/09/20.  Newborn Data: Birth date:12/07/2020  Birth time:5:54 PM  Gender:Female  Living status:Living  Apgars:8 ,9  [phone]   Magnesium Sulfate received: No BMZ received: No Rhophylac:No MMR:No T-DaP:Given prenatally Flu: No Transfusion:No  Physical exam  Vitals:   12/08/20 0428 12/08/20 1258 12/08/20 2240 12/09/20 0606  BP: (!) 104/53 (!) 103/59 112/80 135/80  Pulse: (!) 58 78 70 (!) 58  Resp:  16 17 18   Temp: 98 F (36.7 C) 98.1 F (36.7 C) 97.9 F (36.6 C) (!) 97.5 F (36.4 C)  TempSrc: Oral Oral Oral Oral  SpO2: 97% 97% 97% 98%  Weight:      Height:        Labs: Lab Results  Component Value Date   WBC 14.6 (H) 12/08/2020   HGB 11.1 (L) 12/08/2020   HCT 33.2 (L) 12/08/2020   MCV 92.0 12/08/2020   PLT 263 12/08/2020   No flowsheet data  found. Edinburgh Score: Edinburgh Postnatal Depression Scale Screening Tool 12/08/2020  I have been able to laugh and see the funny side of things. 0  I have looked forward with enjoyment to things. 0  I have blamed myself unnecessarily when things went wrong. 0  I have been anxious or worried for no good reason. 0  I have felt scared or panicky for no good reason. 0  Things have been getting on top of me. 0  I have been so unhappy that I have had difficulty sleeping. 0  I have felt sad or miserable. 0  I have been so unhappy that I have been crying. 0  The thought of harming myself has occurred to me. 0  Edinburgh Postnatal Depression Scale Total 0      After visit meds:  Allergies as of 12/09/2020   No Known Allergies      Medication List     STOP taking these medications    norethindrone-ethinyl estradiol-FE 1-20 MG-MCG tablet Commonly known as: Microgestin FE 1/20       TAKE these medications    acetaminophen 325 MG tablet Commonly known as: Tylenol Take 2 tablets (650 mg total) by mouth every 6 (six) hours as needed (for pain scale < 4).  cetirizine 10 MG tablet Commonly known as: ZYRTEC Take 10 mg by mouth as needed for allergies.   ibuprofen 200 MG tablet Commonly known as: ADVIL Take 3 tablets (600 mg total) by mouth every 6 (six) hours as needed.   multivitamin-prenatal 27-0.8 MG Tabs tablet Take 1 tablet by mouth daily at 12 noon.         Discharge home in stable condition Infant Feeding: breast Infant Disposition:rooming in Discharge instruction: per After Visit Summary and Postpartum booklet. Activity: Advance as tolerated. Pelvic rest for 6 weeks.  Diet: routine diet Anticipated Birth Control: Unsure Postpartum Appointment:4 weeks Additional Postpartum F/U:  none Future Appointments:No future appointments. Follow up Visit:  Follow-up Information     Meisinger, Todd, MD. Schedule an appointment as soon as possible for a visit in 6  week(s).   Specialty: Obstetrics and Gynecology Contact information: [address] [place] [address] [phone]                     12/09/2020 Rowland Lathe, MD

## 2020-12-09 NOTE — Progress Notes (Signed)
Patient is eating, ambulating, voiding.  Pain control is good.  Vitals:   12/08/20 0428 12/08/20 1258 12/08/20 2240 12/09/20 0606  BP: (!) 104/53 (!) 103/59 112/80 135/80  Pulse: (!) 58 78 70 (!) 58  Resp:  '16 17 18  '$ Temp: 98 F (36.7 C) 98.1 F (36.7 C) 97.9 F (36.6 C) (!) 97.5 F (36.4 C)  TempSrc: Oral Oral Oral Oral  SpO2: 97% 97% 97% 98%  Weight:      Height:        Fundus firm Perineum without swelling.  Lab Results  Component Value Date   WBC 14.6 (H) 12/08/2020   HGB 11.1 (L) 12/08/2020   HCT 33.2 (L) 12/08/2020   MCV 92.0 12/08/2020   PLT 263 12/08/2020    --/--/O POS (09/07 0727)/RI  A/P Post partum day 2.  Routine care.  Expect d/c today.   Parents desires circumsision.  All risks, benefits and alternatives discussed with the mother.   Daria Pastures

## 2020-12-11 ENCOUNTER — Ambulatory Visit: Payer: Self-pay

## 2020-12-11 NOTE — Lactation Note (Signed)
This note was copied from a baby's chart. Lactation Consultation Note  Patient Name: Boy Tysheka Gossett [id number] Date: 12/11/2020 Reason for consult: Follow-up assessment Age:26 days Mother pumping when I entered her room. She reports that infant is taking 25 ml of ebm and then 10 ml of formula. Mother reports that she has a question for [facility] but is unable to remember now what it is.  Discussed milk storage guidelines.  Discussed treatment and prevention of engorgement.  Mother is exclusively pumping.  She was advised in pumping 8-10 times dailly. She reports that she  pumps for 30 mins per pumping. She reports that she has become very familiar with her Elvi Stride and that she likes the control system .  Encouraged mother to do STS with infant when possible. Infant is on single photo tx at present.  Mother reports that she hopes to be discharged tomorrow. She would like to see LC in a.am. Suggested that mother follow up with [facility] after discharge.   Maternal Data    Feeding Mother's Current Feeding Choice: Breast Milk  LATCH Score                    Lactation Tools Discussed/Used    Interventions    Discharge Discharge Education: Engorgement and breast care;Outpatient recommendation Pump:  (Elvie Stride hands free)  Consult Status Consult Status: Follow-up Date: 12/12/20 Follow-up type: In-patient    [facility] 12/11/2020, 11:10 AM

## 2020-12-12 ENCOUNTER — Ambulatory Visit: Payer: Self-pay

## 2020-12-12 NOTE — Lactation Note (Signed)
This note was copied from a baby's chart. Lactation Consultation Note  Patient Name: Rebecca Valdez [id number] Date: 12/12/2020 Reason for consult: Follow-up assessment Age:26 days  P1, [redacted]w[redacted]d Mother states baby is now latching with 24 NS and is pumping 2 oz. Per session. Discussed continuing to post pump 4-6 times per day and give volume back to baby. Reviewed engorgement care and monitoring voids/stools.  Maternal Data Does the patient have breastfeeding experience prior to this delivery?: No  Feeding Mother's Current Feeding Choice: Breast Milk Nipple Type: Dr. BClement Husbands Lactation Tools Discussed/Used Tools: Pump;Nipple SJefferson FuelNipple shield size: 24 Pumped volume: 60 mL  Interventions Interventions: DEBP;Education  Discharge Discharge Education: Engorgement and breast care;Warning signs for feeding baby Pump: DEBP;Personal  Consult Status Consult Status: Complete Date: 12/12/20    BV[facility]ne [facility] Center9/03/2021, 12:21 PM

## 2020-12-20 ENCOUNTER — Telehealth (HOSPITAL_COMMUNITY): Payer: Self-pay | Admitting: *Deleted

## 2020-12-20 NOTE — Telephone Encounter (Signed)
Left message to return nurse call.  Odis Hollingshead, RN 12-20-2020 at 2:25pm

## 2021-02-06 ENCOUNTER — Other Ambulatory Visit: Payer: Self-pay | Admitting: Otolaryngology

## 2021-02-06 ENCOUNTER — Other Ambulatory Visit (HOSPITAL_COMMUNITY): Payer: Self-pay | Admitting: Otolaryngology

## 2021-02-06 DIAGNOSIS — C029 Malignant neoplasm of tongue, unspecified: Secondary | ICD-10-CM

## 2021-02-07 ENCOUNTER — Other Ambulatory Visit: Payer: Self-pay | Admitting: Otolaryngology

## 2021-02-07 ENCOUNTER — Ambulatory Visit
Admission: RE | Admit: 2021-02-07 | Discharge: 2021-02-07 | Disposition: A | Discharge status: Home / Self Care | Payer: 59 | Source: Ambulatory Visit | Attending: Otolaryngology | Admitting: Otolaryngology

## 2021-02-07 ENCOUNTER — Other Ambulatory Visit: Payer: Self-pay

## 2021-02-07 DIAGNOSIS — C029 Malignant neoplasm of tongue, unspecified: Secondary | ICD-10-CM

## 2021-02-07 MED ORDER — IOPAMIDOL (ISOVUE-300) INJECTION 61%
75.0000 mL | Freq: Once | INTRAVENOUS | Status: AC | PRN
  Administered 2021-02-07: 75 mL via INTRAVENOUS

## 2021-02-13 ENCOUNTER — Ambulatory Visit (HOSPITAL_COMMUNITY)
Admission: RE | Admit: 2021-02-13 | Discharge: 2021-02-13 | Disposition: A | Discharge status: Home / Self Care | Payer: 59 | Source: Ambulatory Visit | Attending: Otolaryngology | Admitting: Otolaryngology

## 2021-02-13 ENCOUNTER — Other Ambulatory Visit: Payer: Self-pay

## 2021-02-13 DIAGNOSIS — C029 Malignant neoplasm of tongue, unspecified: Secondary | ICD-10-CM | POA: Insufficient documentation

## 2021-02-13 MED ORDER — IOHEXOL 350 MG/ML SOLN
75.0000 mL | Freq: Once | INTRAVENOUS | Status: AC | PRN
  Administered 2021-02-13: 75 mL via INTRAVENOUS

## 2021-02-20 ENCOUNTER — Encounter (HOSPITAL_BASED_OUTPATIENT_CLINIC_OR_DEPARTMENT_OTHER): Payer: Self-pay | Admitting: Otolaryngology

## 2021-02-20 ENCOUNTER — Other Ambulatory Visit: Payer: Self-pay

## 2021-02-27 NOTE — Anesthesia Preprocedure Evaluation (Addendum)
Anesthesia Evaluation  Patient identified by MRN, date of birth, ID band Patient awake    Reviewed: Allergy & Precautions, NPO status , Patient's Chart, lab work & pertinent test results  Airway Mallampati: II  TM Distance: >3 FB Neck ROM: Full    Dental  (+) Teeth Intact, Dental Advisory Given   Pulmonary neg pulmonary ROS,    Pulmonary exam normal breath sounds clear to auscultation       Cardiovascular negative cardio ROS Normal cardiovascular exam Rhythm:Regular Rate:Normal     Neuro/Psych negative neurological ROS  negative psych ROS   GI/Hepatic negative GI ROS, Neg liver ROS,   Endo/Other  negative endocrine ROS  Renal/GU negative Renal ROS  negative genitourinary   Musculoskeletal negative musculoskeletal ROS (+)   Abdominal   Peds  Hematology negative hematology ROS (+)   Anesthesia Other Findings   Reproductive/Obstetrics Recent pregnancy delivered 12/2020                            Anesthesia Physical Anesthesia Plan  ASA: 2  Anesthesia Plan: General   Post-op Pain Management: Dilaudid IV, Tylenol PO (pre-op), Toradol IV (intra-op) and Ketamine IV   Induction: Intravenous  PONV Risk Score and Plan: 4 or greater and Ondansetron, Dexamethasone, Midazolam, Scopolamine patch - Pre-op and Treatment may vary due to age or medical condition  Airway Management Planned: Nasal ETT  Additional Equipment: None  Intra-op Plan:   Post-operative Plan: Extubation in OR  Informed Consent:   Plan Discussed with:   Anesthesia Plan Comments:         Anesthesia Quick Evaluation

## 2021-02-28 ENCOUNTER — Encounter (HOSPITAL_BASED_OUTPATIENT_CLINIC_OR_DEPARTMENT_OTHER)
Admission: RE | Disposition: A | Discharge status: Home / Self Care | Payer: Self-pay | Source: Home / Self Care | Attending: Otolaryngology

## 2021-02-28 ENCOUNTER — Ambulatory Visit (HOSPITAL_BASED_OUTPATIENT_CLINIC_OR_DEPARTMENT_OTHER): Payer: 59 | Admitting: Anesthesiology

## 2021-02-28 ENCOUNTER — Encounter (HOSPITAL_BASED_OUTPATIENT_CLINIC_OR_DEPARTMENT_OTHER): Payer: Self-pay | Admitting: Otolaryngology

## 2021-02-28 ENCOUNTER — Other Ambulatory Visit: Payer: Self-pay

## 2021-02-28 ENCOUNTER — Ambulatory Visit (HOSPITAL_BASED_OUTPATIENT_CLINIC_OR_DEPARTMENT_OTHER)
Admission: RE | Admit: 2021-02-28 | Discharge: 2021-02-28 | Disposition: A | Discharge status: Home / Self Care | Payer: 59 | Attending: Otolaryngology | Admitting: Otolaryngology

## 2021-02-28 DIAGNOSIS — C021 Malignant neoplasm of border of tongue: Secondary | ICD-10-CM | POA: Insufficient documentation

## 2021-02-28 DIAGNOSIS — Z87891 Personal history of nicotine dependence: Secondary | ICD-10-CM | POA: Diagnosis not present

## 2021-02-28 DIAGNOSIS — Z01818 Encounter for other preprocedural examination: Secondary | ICD-10-CM

## 2021-02-28 DIAGNOSIS — C029 Malignant neoplasm of tongue, unspecified: Secondary | ICD-10-CM | POA: Diagnosis present

## 2021-02-28 HISTORY — PX: GLOSSECTOMY, PARTIAL: SHX7210

## 2021-02-28 LAB — POCT PREGNANCY, URINE: Preg Test, Ur: NEGATIVE

## 2021-02-28 SURGERY — GLOSSECTOMY, PARTIAL
Anesthesia: General | Site: Mouth | Laterality: Left

## 2021-02-28 MED ORDER — OXYCODONE HCL 5 MG PO TABS: 5.0000 mg | ORAL_TABLET | Freq: Once | ORAL | Status: DC | PRN

## 2021-02-28 MED ORDER — FENTANYL CITRATE (PF) 100 MCG/2ML IJ SOLN
INTRAMUSCULAR | Status: DC | PRN
  Administered 2021-02-28 (×4): 50 ug via INTRAVENOUS

## 2021-02-28 MED ORDER — FENTANYL CITRATE (PF) 100 MCG/2ML IJ SOLN
INTRAMUSCULAR | Status: AC
  Filled 2021-02-28: qty 2

## 2021-02-28 MED ORDER — ACETAMINOPHEN 10 MG/ML IV SOLN
1000.0000 mg | Freq: Once | INTRAVENOUS | Status: AC
  Administered 2021-02-28: 1000 mg via INTRAVENOUS

## 2021-02-28 MED ORDER — KETAMINE HCL 10 MG/ML IJ SOLN
INTRAMUSCULAR | Status: DC | PRN
  Administered 2021-02-28: 10 mg via INTRAVENOUS
  Administered 2021-02-28: 20 mg via INTRAVENOUS
  Administered 2021-02-28: 10 mg via INTRAVENOUS
  Administered 2021-02-28: 20 mg via INTRAVENOUS

## 2021-02-28 MED ORDER — LACTATED RINGERS IV SOLN: INTRAVENOUS | Status: DC

## 2021-02-28 MED ORDER — HYDROMORPHONE HCL 1 MG/ML IJ SOLN
INTRAMUSCULAR | Status: AC
  Filled 2021-02-28: qty 0.5

## 2021-02-28 MED ORDER — DEXAMETHASONE SODIUM PHOSPHATE 4 MG/ML IJ SOLN
INTRAMUSCULAR | Status: DC | PRN
  Administered 2021-02-28: 10 mg via INTRAVENOUS

## 2021-02-28 MED ORDER — MIDAZOLAM HCL 2 MG/2ML IJ SOLN
INTRAMUSCULAR | Status: AC
  Filled 2021-02-28: qty 2

## 2021-02-28 MED ORDER — ONDANSETRON HCL 4 MG/2ML IJ SOLN
INTRAMUSCULAR | Status: DC | PRN
  Administered 2021-02-28: 4 mg via INTRAVENOUS

## 2021-02-28 MED ORDER — ACETAMINOPHEN 500 MG PO TABS
1000.0000 mg | ORAL_TABLET | Freq: Once | ORAL | Status: AC
  Administered 2021-02-28: 1000 mg via ORAL

## 2021-02-28 MED ORDER — ROCURONIUM BROMIDE 100 MG/10ML IV SOLN
INTRAVENOUS | Status: DC | PRN
  Administered 2021-02-28: 60 mg via INTRAVENOUS

## 2021-02-28 MED ORDER — LIDOCAINE 2% (20 MG/ML) 5 ML SYRINGE
INTRAMUSCULAR | Status: AC
  Filled 2021-02-28: qty 5

## 2021-02-28 MED ORDER — AMISULPRIDE (ANTIEMETIC) 5 MG/2ML IV SOLN
10.0000 mg | Freq: Once | INTRAVENOUS | Status: AC | PRN
  Administered 2021-02-28: 10 mg via INTRAVENOUS

## 2021-02-28 MED ORDER — SCOPOLAMINE 1 MG/3DAYS TD PT72
1.0000 | MEDICATED_PATCH | TRANSDERMAL | Status: DC
  Administered 2021-02-28: 1.5 mg via TRANSDERMAL

## 2021-02-28 MED ORDER — OXYCODONE HCL 5 MG/5ML PO SOLN: 5.0000 mg | Freq: Once | ORAL | Status: DC | PRN

## 2021-02-28 MED ORDER — PROMETHAZINE HCL 25 MG/ML IJ SOLN
6.2500 mg | INTRAMUSCULAR | Status: DC | PRN
  Administered 2021-02-28: 6.25 mg via INTRAVENOUS

## 2021-02-28 MED ORDER — PROPOFOL 10 MG/ML IV BOLUS
INTRAVENOUS | Status: DC | PRN
  Administered 2021-02-28: 150 mg via INTRAVENOUS

## 2021-02-28 MED ORDER — SCOPOLAMINE 1 MG/3DAYS TD PT72
MEDICATED_PATCH | TRANSDERMAL | Status: AC
  Filled 2021-02-28: qty 1

## 2021-02-28 MED ORDER — LIDOCAINE HCL (CARDIAC) PF 100 MG/5ML IV SOSY
PREFILLED_SYRINGE | INTRAVENOUS | Status: DC | PRN
  Administered 2021-02-28: 60 mg via INTRAVENOUS

## 2021-02-28 MED ORDER — AMISULPRIDE (ANTIEMETIC) 5 MG/2ML IV SOLN
INTRAVENOUS | Status: AC
  Filled 2021-02-28: qty 2

## 2021-02-28 MED ORDER — HYDROMORPHONE HCL 1 MG/ML IJ SOLN
0.2500 mg | INTRAMUSCULAR | Status: DC | PRN
  Administered 2021-02-28 (×2): 0.25 mg via INTRAVENOUS
  Administered 2021-02-28 (×2): 0.5 mg via INTRAVENOUS

## 2021-02-28 MED ORDER — ACETAMINOPHEN 500 MG PO TABS
ORAL_TABLET | ORAL | Status: AC
  Filled 2021-02-28: qty 2

## 2021-02-28 MED ORDER — BACITRACIN ZINC 500 UNIT/GM EX OINT
TOPICAL_OINTMENT | CUTANEOUS | Status: AC
  Filled 2021-02-28: qty 0.9

## 2021-02-28 MED ORDER — AMOXICILLIN 875 MG PO TABS: 875.0000 mg | ORAL_TABLET | Freq: Two times a day (BID) | ORAL | 0 refills | Status: AC

## 2021-02-28 MED ORDER — KETAMINE HCL 100 MG/ML IJ SOLN
INTRAMUSCULAR | Status: AC
  Filled 2021-02-28: qty 1

## 2021-02-28 MED ORDER — ONDANSETRON HCL 4 MG/2ML IJ SOLN
INTRAMUSCULAR | Status: AC
  Filled 2021-02-28: qty 2

## 2021-02-28 MED ORDER — PROMETHAZINE HCL 25 MG/ML IJ SOLN
INTRAMUSCULAR | Status: AC
  Filled 2021-02-28: qty 1

## 2021-02-28 MED ORDER — GLYCOPYRROLATE 0.2 MG/ML IJ SOLN
INTRAMUSCULAR | Status: DC | PRN
  Administered 2021-02-28: .3 mg via INTRAVENOUS

## 2021-02-28 MED ORDER — OXYCODONE-ACETAMINOPHEN 5-325 MG PO TABS: 1.0000 | ORAL_TABLET | ORAL | 0 refills | Status: AC | PRN

## 2021-02-28 MED ORDER — ACETAMINOPHEN 10 MG/ML IV SOLN
INTRAVENOUS | Status: AC
  Filled 2021-02-28: qty 100

## 2021-02-28 MED ORDER — MIDAZOLAM HCL 5 MG/5ML IJ SOLN
INTRAMUSCULAR | Status: DC | PRN
  Administered 2021-02-28: 2 mg via INTRAVENOUS

## 2021-02-28 MED ORDER — CEFAZOLIN SODIUM-DEXTROSE 2-3 GM-%(50ML) IV SOLR
INTRAVENOUS | Status: DC | PRN
  Administered 2021-02-28: 2 g via INTRAVENOUS

## 2021-02-28 MED ORDER — MEPERIDINE HCL 25 MG/ML IJ SOLN: 6.2500 mg | INTRAMUSCULAR | Status: DC | PRN

## 2021-02-28 MED ORDER — HYDROMORPHONE HCL 1 MG/ML IJ SOLN
INTRAMUSCULAR | Status: DC | PRN
  Administered 2021-02-28: .5 mg via INTRAVENOUS

## 2021-02-28 MED ORDER — PHENYLEPHRINE HCL (PRESSORS) 10 MG/ML IV SOLN
INTRAVENOUS | Status: DC | PRN
  Administered 2021-02-28: 40 ug via INTRAVENOUS
  Administered 2021-02-28: 80 ug via INTRAVENOUS

## 2021-02-28 MED ORDER — NEOSTIGMINE METHYLSULFATE 10 MG/10ML IV SOLN
INTRAVENOUS | Status: DC | PRN
  Administered 2021-02-28: 2 mg via INTRAVENOUS

## 2021-02-28 SURGICAL SUPPLY — 43 items
BLADE SURG 15 STRL LF DISP TIS (BLADE) ×1 IMPLANT
BLADE SURG 15 STRL SS (BLADE) ×2
CANISTER SUCT 1200ML W/VALVE (MISCELLANEOUS) ×2 IMPLANT
COAGULATOR SUCT 8FR VV (MISCELLANEOUS) ×2 IMPLANT
CORD BIPOLAR FORCEPS 12FT (ELECTRODE) IMPLANT
COVER MAYO STAND STRL (DRAPES) ×2 IMPLANT
DECANTER SPIKE VIAL GLASS SM (MISCELLANEOUS) IMPLANT
DEPRESSOR TONGUE BLADE STERILE (MISCELLANEOUS) ×1 IMPLANT
ELECT COATED BLADE 2.86 ST (ELECTRODE) ×1 IMPLANT
ELECT NDL BLADE 2-5/6 (NEEDLE) IMPLANT
ELECT NEEDLE BLADE 2-5/6 (NEEDLE) ×2 IMPLANT
ELECT REM PT RETURN 9FT ADLT (ELECTROSURGICAL) ×2
ELECT REM PT RETURN 9FT PED (ELECTROSURGICAL)
ELECTRODE REM PT RETRN 9FT PED (ELECTROSURGICAL) IMPLANT
ELECTRODE REM PT RTRN 9FT ADLT (ELECTROSURGICAL) IMPLANT
FORCEPS BIPOLAR SPETZLER 8 1.0 (NEUROSURGERY SUPPLIES) IMPLANT
GLOVE SURG ENC MOIS LTX SZ7.5 (GLOVE) ×3 IMPLANT
GLOVE SURG POLYISO LF SZ6.5 (GLOVE) ×1 IMPLANT
GLOVE SURG UNDER POLY LF SZ7 (GLOVE) ×1 IMPLANT
GOWN STRL REUS W/ TWL LRG LVL3 (GOWN DISPOSABLE) ×1 IMPLANT
GOWN STRL REUS W/ TWL XL LVL3 (GOWN DISPOSABLE) IMPLANT
GOWN STRL REUS W/TWL LRG LVL3 (GOWN DISPOSABLE) ×4
GOWN STRL REUS W/TWL XL LVL3 (GOWN DISPOSABLE) ×2
NDL HYPO 27GX1-1/4 (NEEDLE) ×1 IMPLANT
NEEDLE HYPO 27GX1-1/4 (NEEDLE) ×2 IMPLANT
PACK BASIN DAY SURGERY FS (CUSTOM PROCEDURE TRAY) ×2 IMPLANT
PACK ENT DAY SURGERY (CUSTOM PROCEDURE TRAY) ×1 IMPLANT
PENCIL FOOT CONTROL (ELECTRODE) ×1 IMPLANT
PENCIL SMOKE EVACUATOR (MISCELLANEOUS) ×2 IMPLANT
SHEET MEDIUM DRAPE 40X70 STRL (DRAPES) ×1 IMPLANT
SLEEVE SCD COMPRESS KNEE MED (STOCKING) ×1 IMPLANT
SUCTION FRAZIER HANDLE 10FR (MISCELLANEOUS) ×2
SUCTION TUBE FRAZIER 10FR DISP (MISCELLANEOUS) IMPLANT
SUT CHROMIC 3 0 PS 2 (SUTURE) IMPLANT
SUT SILK 3 0 TIES 17X18 (SUTURE)
SUT SILK 3-0 18XBRD TIE BLK (SUTURE) IMPLANT
SUT VIC AB 3-0 PS1 18 (SUTURE) ×6
SUT VIC AB 3-0 PS1 18XBRD (SUTURE) IMPLANT
SUT VIC AB 4-0 PS2 27 (SUTURE) ×1 IMPLANT
SYR CONTROL 10ML LL (SYRINGE) ×1 IMPLANT
TOWEL GREEN STERILE FF (TOWEL DISPOSABLE) ×2 IMPLANT
TUBE CONNECTING 20X1/4 (TUBING) ×2 IMPLANT
YANKAUER SUCT BULB TIP NO VENT (SUCTIONS) ×2 IMPLANT

## 2021-02-28 NOTE — Anesthesia Postprocedure Evaluation (Signed)
Anesthesia Post Note  Patient: Rebecca Valdez, Rebecca Valdez  Procedure(s) Performed: LEFT PARTIAL GLOSSECTOMY (Left: Mouth)     Patient location during evaluation: PACU Anesthesia Type: General Level of consciousness: awake and alert, oriented and patient cooperative Pain management: pain level controlled Vital Signs Assessment: post-procedure vital signs reviewed and stable Respiratory status: spontaneous breathing, nonlabored ventilation and respiratory function stable Cardiovascular status: blood pressure returned to baseline and stable Postop Assessment: no apparent nausea or vomiting Anesthetic complications: no   No notable events documented.  Last Vitals:  Vitals:   02/28/21 1252 02/28/21 1300  BP:  118/81  Pulse: 85 85  Resp: 12 (!) 21  Temp:    SpO2: 93% 93%    Last Pain:  Vitals:   02/28/21 1306  TempSrc:   PainSc: [place]

## 2021-02-28 NOTE — Transfer of Care (Signed)
Immediate Anesthesia Transfer of Care Note  Patient: Rebecca Valdez  Procedure(s) Performed: LEFT PARTIAL GLOSSECTOMY (Left: Mouth)  Patient Location: PACU  Anesthesia Type:General  Level of Consciousness: sedated  Airway & Oxygen Therapy: Patient Spontanous Breathing and Patient connected to face mask oxygen  Post-op Assessment: Report given to RN and Post -op Vital signs reviewed and stable  Post vital signs: Reviewed and stable  Last Vitals:  Vitals Value Taken Time  BP 118/75 02/28/21 1215  Temp    Pulse 75 02/28/21 1216  Resp 16 02/28/21 1216  SpO2 97 % 02/28/21 1216  Vitals shown include unvalidated device data.  Last Pain:  Vitals:   02/28/21 0805  TempSrc: Oral  PainSc: 0-No pain      Patients Stated Pain Goal: 5 (43/15/40 0867)  Complications: No notable events documented.

## 2021-02-28 NOTE — Anesthesia Procedure Notes (Signed)
Procedure Name: Intubation Date/Time: 02/28/2021 9:43 AM Performed by: Maryella Shivers, CRNA Pre-anesthesia Checklist: Patient identified, Emergency Drugs available, Suction available and Patient being monitored Patient Re-evaluated:Patient Re-evaluated prior to induction Oxygen Delivery Method: Circle system utilized Preoxygenation: Pre-oxygenation with 100% oxygen Induction Type: IV induction Ventilation: Mask ventilation without difficulty Laryngoscope Size: Mac and 3 Grade View: Grade I Tube type: Oral Tube size: 7.0 mm Number of attempts: 1 Airway Equipment and Method: Stylet and Oral airway Placement Confirmation: ETT inserted through vocal cords under direct vision, positive ETCO2 and breath sounds checked- equal and bilateral Secured at: 20 cm Tube secured with: Tape Dental Injury: Teeth and Oropharynx as per pre-operative assessment

## 2021-02-28 NOTE — Op Note (Signed)
DATE OF PROCEDURE:  02/28/2021                              OPERATIVE REPORT  SURGEON:  Leta Baptist, MD  PREOPERATIVE DIAGNOSES: 1.  Left lateral tongue squamous cell carcinoma  POSTOPERATIVE DIAGNOSES: 1.  Left lateral tongue squamous cell carcinoma  PROCEDURE PERFORMED: Left partial glossectomy  ANESTHESIA:  General endotracheal tube anesthesia.  COMPLICATIONS:  None.  ESTIMATED BLOOD LOSS:  200 ml.  INDICATION FOR PROCEDURE:  Rebecca Valdez is a 25 y.o. female who first noted a small left lateral tongue lesion in 07/2020.  The area was biopsied by Dr. Luvenia Heller. The pathology was evaluated at the [facility].  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  It was P16 negative.  Her neck and chest CT scans were all negative.  Based on the above findings, the decision was made for the patient to undergo the left partial glossectomy procedure. The risks, benefits, alternatives, and details of the procedure were discussed with the patient.  Questions were invited and answered.  Informed consent was obtained.  DESCRIPTION:  The patient was taken to the operating room and placed supine on the operating table.  General endotracheal tube anesthesia was administered by the anesthesiologist.  The patient was positioned and prepped and draped in a standard fashion for oral surgery.  A Molt retractor was used to expose the oral cavity.  Examination of the tongue revealed an area of mucosal irregularity on the left lateral surface of the tongue.  Palpation of the left lateral tongue revealed a firm submucosal mass that is 1.5 cm in diameter. The entire mass was excision using a combination of sharp dissection and Bovie electrocutery device.  The specimen was sent to the pathology for permanent histologic identification.  Surgical margins  were then obtained from the tongue.  The margins were divided into anterior superior, anterior inferior, posterior  superior, posterior inferior, and deep sections.  Frozen section analysis of the surgical margins were negative for the anterior superior, anterior inferior, posterior superior, posterior inferior sections.  However, cellular atypia was noted within the deep margin on frozen section.  Additional dissection was performed to removed more deep tissues.  Two additional surgical margins were sent to the pathologist.  They were labeled anterior-deep and posterior-deep margins.  The additional deep margins were negative for cellular atypia or malignancy.  The surgical site was copiously irrigated.  Hemostasis was achieved with electrocautery.  The incision was closed with interrupted 3-0 Vicryl sutures.  The care of the patient was turned over to the anesthesiologist.  The patient was awakened from anesthesia without difficulty.  The patient was extubated and transferred to the recovery room in good condition.  OPERATIVE FINDINGS: Left lateral tongue squamous cell carcinoma.  SPECIMEN: Left lateral tongue squamous cell carcinoma specimen.  First round of surgical margins: Anterior superior, anterior inferior, posterior superior, posterior inferior, and deep.  Second round of surgical margins: Anterior deep and posterior deep.  FOLLOWUP CARE:  The patient will be discharged home once awake and alert.  She will be placed on amoxicillin 875 mg p.o. b.i.d. for 3 days, and Percocet for pain control.   The patient will follow up in my office in 1 week.  Jasiah Elsen W Keyshawna Prouse 02/28/2021 11:43 AM

## 2021-02-28 NOTE — Discharge Instructions (Addendum)
Partial Glossectomy, Care After This sheet gives you information about how to care for yourself after your procedure. Your health care provider may also give you more specific instructions. If you have problems or questions, contact your health care provider. What can I expect after the procedure? After the procedure, it is common to have: Difficulty swallowing, eating, and speaking while you recover from your surgery. Difficulty moving your mouth and jaw. Pain and soreness in your tongue and mouth. A small amount of blood or clear fluid coming from your incisions. Follow these instructions at home: Medicines Take over-the-counter and prescription medicines only as told by your health care provider. If you were prescribed an antibiotic medicine, take it as told by your health care provider. Do not stop using the antibiotic even if you start to feel better. Ask your health care provider if the medicine prescribed to you: Requires you to avoid driving or using heavy machinery. Can cause constipation. You may need to take actions to prevent or treat constipation, such as: Drink enough fluid to keep your urine pale yellow. Take over-the-counter or prescription medicines.  Eating and drinking Follow instructions from your health care provider about eating and drinking restrictions. Restrictions vary depending on the type of procedure you had. If you are eating by mouth, rinse your mouth with water after every meal to prevent food from becoming trapped in your mouth. Activity Return to your normal activities as told by your health care provider. Ask your health care provider what activities are safe for you. Sleep and rest with your head raised (elevated) to help reduce swelling. General instructions Do not use any products that contain nicotine or tobacco, such as cigarettes, e-cigarettes, and chewing tobacco. If you need help quitting, ask your health care provider. Gargle or swish water in  your mouth as told by your health care provider. Keep all follow-up visits as told by your health care provider. This is important. Contact a health care provider if: You have pain that gets worse or does not get better with medicine. You have more redness, swelling, or pain around an incision. You have pus or a bad smell coming from an incision. It gets more difficult for you to chew and swallow. You have a cough that gets worse. You feel nauseous. Get help right away if: You have new shortness of breath. You have a fast heartbeat. You have a lot of blood coming from your incisions in your mouth or neck. You have chest pain. Summary It is common to have difficulty swallowing, eating, and speaking while you recover from your surgery. Check your incision areas every day for signs of infection. Follow instructions from your health care provider about eating and drinking restrictions. Keep all follow-up visits as told by your health care provider. This is important. This information is not intended to replace advice given to you by your health care provider. Make sure you discuss any questions you have with your health care provider. Document Revised: 01/20/2018 Document Reviewed: 01/20/2018 Elsevier Patient Education  2022 [place] Instructions  Activity: Get plenty of rest for the remainder of the day. A responsible individual must stay with you for 24 hours following the procedure.  For the next 24 hours, DO NOT: -Drive a car -Paediatric nurse -Drink alcoholic beverages -Take any medication unless instructed by your physician -Make any legal decisions or sign important papers.  Meals: Start with liquid foods such as gelatin or soup.  Progress to regular foods as tolerated. Avoid greasy, spicy, heavy foods. If nausea and/or vomiting occur, drink only clear liquids until the nausea and/or vomiting subsides. Call your physician if vomiting  continues.  Special Instructions/Symptoms: Your throat may feel dry or sore from the anesthesia or the breathing tube placed in your throat during surgery. If this causes discomfort, gargle with warm salt water. The discomfort should disappear within 24 hours.  If you had a scopolamine patch placed behind your ear for the management of post- operative nausea and/or vomiting:  1. The medication in the patch is effective for 72 hours, after which it should be removed.  Wrap patch in a tissue and discard in the trash. Wash hands thoroughly with soap and water. 2. You may remove the patch earlier than 72 hours if you experience unpleasant side effects which may include dry mouth, dizziness or visual disturbances. 3. Avoid touching the patch. Wash your hands with soap and water after contact with the patch.     No Tylenol until 2:30pm today.

## 2021-02-28 NOTE — H&P (Signed)
Cc: Tongue cancer  HPI: The patient is a 25 year old female who presents today for evaluation of her tongue cancer.  According to the patient, she first noted a small left lateral tongue lesion in 07/2020.  The area was recently biopsied by Dr. Luvenia Heller.  The pathology was evaluated at the [facility].  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  It was P16 negative.  According to the patient, she is currently asymptomatic.  She is tolerating oral intake well.  She has no previous history of tobacco use.  She also denies previous ENT surgery.     The patient's review of systems (constitutional, eyes, ENT, cardiovascular, respiratory, GI, musculoskeletal, skin, neurologic, psychiatric, endocrine, hematologic, allergic) is noted in the ROS questionnaire.  It is reviewed with the patient.  Major events: Tongue biopsy/ cancer.  Ongoing medical problems: Tongue cancer.  Family health history: Diabetes, heart disease.  Social history: The patient is married. She is a former smoke. She rarely drinks alcohol. she denies the use of illegal drugs.    Exam: General: Communicates without difficulty, well nourished, no acute distress. Head: Normocephalic, no evidence injury, no tenderness, facial buttresses intact without stepoff. Face/sinus: No tenderness to palpation and percussion. Facial movement is normal and symmetric. Eyes: PERRL, EOMI. No scleral icterus, conjunctivae clear. Neuro: CN II exam reveals vision grossly intact.  No nystagmus at any point of gaze. Ears: Auricles well formed without lesions.  Ear canals are intact without mass or lesion.  No erythema or edema is appreciated.  The TMs are intact without fluid. Nose: External evaluation reveals normal support and skin without lesions.  Dorsum is intact.  Anterior rhinoscopy reveals pink mucosa over anterior aspect of inferior turbinates and intact septum.  No purulence noted. Oral: An area  approximately 1 cm in diameter is noted to be discolored. Neck: Full range of motion without pain.  There is no significant lymphadenopathy.  No masses palpable.  Thyroid bed within normal limits to palpation.  Parotid glands and submandibular glands equal bilaterally without mass.  Trachea is midline. Neuro:  CN 2-12 grossly intact. Gait normal.   Assessment  1.  Left lateral tongue squamous cell carcinoma.  It is P16 negative.  2.  On examination, an area approximately 1 cm in diameter is noted to be discolored.  No obviously palpable mass is noted today.  3.  No palpable lymphadenopathy is noted on today's physical exam.   Plan: 1.  The physical exam findings and the pathology reports are reviewed with the patient.  2.  It is explained to the patient that tongue cancer has a propensity  to metastasize regionally to the cervical lymph nodes.  I would like to obtain a neck CT scan to evaluate for possible lymphadenopathy.  3.  The patient will most likely need to undergo left partial glossectomy with or without left neck dissection.  The resection margin will be evaluated with intraoperative frozen section analysis.   4.  The risks, benefits, alternatives and details of the procedure are extensively reviewed.  Questions are invited and answered.  5.  The patient would like to proceed with the procedure.

## 2021-02-28 NOTE — Addendum Note (Signed)
Addendum  created 02/28/21 1348 by Pervis Hocking, DO   Order list changed, Pharmacy for encounter modified

## 2021-03-02 LAB — SURGICAL PATHOLOGY

## 2021-03-07 ENCOUNTER — Other Ambulatory Visit (HOSPITAL_COMMUNITY): Payer: Self-pay | Admitting: Otolaryngology

## 2021-03-07 DIAGNOSIS — C022 Malignant neoplasm of ventral surface of tongue: Secondary | ICD-10-CM

## 2021-03-16 ENCOUNTER — Encounter (HOSPITAL_COMMUNITY)
Admission: RE | Admit: 2021-03-16 | Discharge: 2021-03-16 | Disposition: A | Discharge status: Home / Self Care | Payer: 59 | Source: Ambulatory Visit | Attending: Otolaryngology | Admitting: Otolaryngology

## 2021-03-16 ENCOUNTER — Other Ambulatory Visit: Payer: Self-pay

## 2021-03-16 DIAGNOSIS — C022 Malignant neoplasm of ventral surface of tongue: Secondary | ICD-10-CM | POA: Insufficient documentation

## 2021-03-16 MED ORDER — FLUDEOXYGLUCOSE F - 18 (FDG) INJECTION
8.6540 | Freq: Once | INTRAVENOUS | Status: AC | PRN
  Administered 2021-03-16: 8.654 via INTRAVENOUS

## 2021-03-22 ENCOUNTER — Encounter: Payer: Self-pay | Admitting: Radiation Oncology

## 2021-03-22 NOTE — Progress Notes (Signed)
Per tumor board review today, patient's margins are negative but it is difficult for pathology to define how much margin there is. Approx 1-9mm ? PNI.

## 2021-04-19 ENCOUNTER — Encounter: Payer: Self-pay | Admitting: Radiation Oncology

## 2021-04-19 DIAGNOSIS — D0007 Carcinoma in situ of tongue: Secondary | ICD-10-CM | POA: Diagnosis not present

## 2021-04-19 DIAGNOSIS — C029 Malignant neoplasm of tongue, unspecified: Secondary | ICD-10-CM | POA: Insufficient documentation

## 2021-04-19 NOTE — Progress Notes (Signed)
Dr. Hendricks Limes called me about this patient who was referred to him by Dr Benjamine Mola. He is going to consider further surgical options, the pros/cons of more surgery, and call me back after seeing the patient.    -----------------------------------  Rebecca Gibson, MD

## 2021-04-27 DIAGNOSIS — K140 Glossitis: Secondary | ICD-10-CM | POA: Diagnosis not present

## 2021-04-27 DIAGNOSIS — K148 Other diseases of tongue: Secondary | ICD-10-CM | POA: Diagnosis not present

## 2021-04-27 DIAGNOSIS — C021 Malignant neoplasm of border of tongue: Secondary | ICD-10-CM | POA: Diagnosis not present

## 2021-05-09 DIAGNOSIS — C029 Malignant neoplasm of tongue, unspecified: Secondary | ICD-10-CM | POA: Diagnosis not present

## 2021-05-22 DIAGNOSIS — C021 Malignant neoplasm of border of tongue: Secondary | ICD-10-CM | POA: Diagnosis not present

## 2021-05-22 DIAGNOSIS — Z20822 Contact with and (suspected) exposure to covid-19: Secondary | ICD-10-CM | POA: Diagnosis not present

## 2021-05-23 DIAGNOSIS — C029 Malignant neoplasm of tongue, unspecified: Secondary | ICD-10-CM | POA: Diagnosis not present

## 2021-05-23 DIAGNOSIS — C021 Malignant neoplasm of border of tongue: Secondary | ICD-10-CM | POA: Diagnosis not present

## 2021-06-01 DIAGNOSIS — C029 Malignant neoplasm of tongue, unspecified: Secondary | ICD-10-CM | POA: Diagnosis not present

## 2021-08-03 DIAGNOSIS — Z08 Encounter for follow-up examination after completed treatment for malignant neoplasm: Secondary | ICD-10-CM | POA: Diagnosis not present

## 2021-08-03 DIAGNOSIS — Z85810 Personal history of malignant neoplasm of tongue: Secondary | ICD-10-CM | POA: Diagnosis not present

## 2021-12-11 ENCOUNTER — Other Ambulatory Visit (HOSPITAL_BASED_OUTPATIENT_CLINIC_OR_DEPARTMENT_OTHER): Payer: Self-pay | Admitting: Otolaryngology

## 2021-12-11 DIAGNOSIS — C021 Malignant neoplasm of border of tongue: Secondary | ICD-10-CM

## 2021-12-17 ENCOUNTER — Ambulatory Visit (HOSPITAL_BASED_OUTPATIENT_CLINIC_OR_DEPARTMENT_OTHER)
Admission: RE | Admit: 2021-12-17 | Discharge: 2021-12-17 | Disposition: A | Discharge status: Home / Self Care | Payer: BC Managed Care – PPO | Source: Ambulatory Visit | Attending: Otolaryngology | Admitting: Otolaryngology

## 2021-12-17 ENCOUNTER — Encounter (HOSPITAL_BASED_OUTPATIENT_CLINIC_OR_DEPARTMENT_OTHER): Payer: Self-pay

## 2021-12-17 DIAGNOSIS — C021 Malignant neoplasm of border of tongue: Secondary | ICD-10-CM | POA: Insufficient documentation

## 2021-12-17 MED ORDER — IOHEXOL 300 MG/ML  SOLN
75.0000 mL | Freq: Once | INTRAMUSCULAR | Status: AC | PRN
  Administered 2021-12-17: 75 mL via INTRAVENOUS

## 2022-01-12 ENCOUNTER — Ambulatory Visit
Admission: EM | Admit: 2022-01-12 | Discharge: 2022-01-12 | Disposition: A | Discharge status: Home / Self Care | Payer: BC Managed Care – PPO

## 2022-01-12 DIAGNOSIS — M545 Low back pain, unspecified: Secondary | ICD-10-CM

## 2022-01-12 DIAGNOSIS — R1030 Lower abdominal pain, unspecified: Secondary | ICD-10-CM

## 2022-01-12 HISTORY — DX: Malignant neoplasm of tongue, unspecified: C02.9

## 2022-01-12 LAB — POCT URINALYSIS DIP (MANUAL ENTRY)
Bilirubin, UA: NEGATIVE
Glucose, UA: NEGATIVE mg/dL
Ketones, POC UA: NEGATIVE mg/dL
Leukocytes, UA: NEGATIVE
Nitrite, UA: NEGATIVE
Protein Ur, POC: NEGATIVE mg/dL
Spec Grav, UA: <=1.005 — AB (ref 1.010–1.025)
Urobilinogen, UA: 0.2 E.U./dL
pH, UA: 5 (ref 5.0–8.0)

## 2022-01-12 LAB — POCT URINE PREGNANCY: Preg Test, Ur: NEGATIVE

## 2022-01-12 NOTE — Discharge Instructions (Signed)
The urinalysis does not indicate a true urinary tract infection.  A urine culture has been ordered.  You will be contacted if the results of the culture are positive. Increase fluids and allow for plenty of rest. Continue use of Tylenol to help with pain, fever, or general discomfort. Recommend toileting every 2 hours. Follow-up in the emergency department immediately if you develop fever, chills, worsening abdominal pain, nausea, vomiting, or other concerns. Follow-up as needed.

## 2022-01-12 NOTE — ED Triage Notes (Signed)
Pt reports lower abdominal pain when holding the urine and on and off lower back pain x 1 day. 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

## 2022-01-12 NOTE — ED Provider Notes (Signed)
[facility]    CSN: [id number] Arrival date & time: 01/12/22  1147      History   Chief Complaint Chief Complaint  Patient presents with   Dysuria   Abdominal Pain    HPI Rebecca Valdez is a 26 y.o. female.   The history is provided by the patient.   Patient presents with a 1 day history of lower abdominal pain and intermittent back pain.  Patient states she noticed that she had some lower abdominal pain when she was in the waiting room holding her urine.  She states that the low back pain has come and gone, denies back pain at present.  Patient denies fever, chills, nausea, vomiting, diarrhea, urinary urgency, frequency, hesitancy, dysuria, or hematuria or vaginal symptoms..  Patient states that she reached out to her oncologist who is treating her for tongue cancer and was told to be checked for a urinary tract infection.  Patient states that she wanted to make sure she did not have a UTI prior to her surgery on 01/16/2022.  Patient states that she has been taking oxycodone at bedtime over the last several days to help with her pain in addition to gabapentin and Tylenol.  She states that she normally takes 1 oxycodone at night, but has had to take 2 over the last several days to help with pain control.  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   Tongue cancer [facility])     Patient Active Problem List   Diagnosis Date Noted   Tongue cancer ([place]) 04/19/2021   Indication for care in labor or delivery 12/07/2020   Encounter for surveillance of contraceptive pills 06/15/2019   Encounter for gynecological examination with Papanicolaou smear of cervix 06/15/2019   Exposure to strep throat 05/07/2013   Sore throat 05/07/2013   Contraceptive management 05/01/2013   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 Authorizing Provider  acetaminophen (TYLENOL) 500 MG tablet Take by mouth.    [provider]  gabapentin (NEURONTIN) 100 MG capsule Take 100 mg by mouth 3 (three) times daily. 01/04/22   [provider]  norethindrone (MICRONOR) 0.35 MG tablet Take 1 tablet by mouth daily.    [provider]  oxyCODONE (OXY IR/ROXICODONE) 5 MG immediate release tablet Take 5 mg by mouth every 4 (four) hours as needed. 01/03/22   [provider]    Family History Family History  Problem Relation Age of Onset   Hypertension Mother    Heart disease Maternal Grandmother    Cancer Maternal Grandmother        breast   Heart disease Maternal Grandfather    Diabetes Paternal Grandmother    Heart disease Paternal Grandmother        CHF   Hypertension  Paternal Grandmother    Diabetes Paternal Grandfather     Social History Social History   Tobacco Use   Smoking status: Never    Passive exposure: Yes   Smokeless tobacco: Never  Vaping Use   Vaping Use: Never used  Substance Use Topics   Alcohol use: Yes    Comment: rarely   Drug use: No     Allergies   Patient has no known allergies.   Review of Systems Review of Systems Per HPI  Physical Exam Triage Vital Signs ED Triage Vitals  Enc Vitals Group     BP 01/12/22 1219 126/86     Pulse Rate 01/12/22 1219 77     Resp 01/12/22 1219 16     Temp 01/12/22 1219 98 F (36.7 C)     Temp Source 01/12/22 1219 Oral     SpO2 01/12/22 1219 98 %     Weight --      Height --      Head Circumference --      Peak Flow --      Pain Score 01/12/22 1211 1     Pain Loc --      Pain Edu? --      Excl. in [place]? --    No data  found.  Updated Vital Signs BP 126/86 (BP Location: Right Arm)   Pulse 77   Temp 98 F (36.7 C) (Oral)   Resp 16   LMP 12/23/2021 (Exact Date)   SpO2 98%   Breastfeeding No   Visual Acuity Right Eye Distance:   Left Eye Distance:   Bilateral Distance:    Right Eye Near:   Left Eye Near:    Bilateral Near:     Physical Exam Vitals and nursing note reviewed.  Constitutional:      General: She is not in acute distress.    Appearance: She is well-developed.  HENT:     Head: Normocephalic.  Eyes:     Extraocular Movements: Extraocular movements intact.     Pupils: Pupils are equal, round, and reactive to light.  Cardiovascular:     Rate and Rhythm: Normal rate and regular rhythm.     Heart sounds: Normal heart sounds.  Pulmonary:     Effort: Pulmonary effort is normal.     Breath sounds: Normal breath sounds.  Abdominal:     General: Abdomen is flat. Bowel sounds are normal. There is no distension.     Palpations: Abdomen is soft.     Tenderness: There is no abdominal tenderness. There is no right CVA tenderness or left CVA tenderness.  Musculoskeletal:     Cervical back: Normal range of motion.  Lymphadenopathy:     Cervical: No cervical adenopathy.  Skin:    General: Skin is warm and dry.  Neurological:     General: No focal deficit present.     Mental Status: She is alert and oriented to person, place, and time.  Psychiatric:        Mood and Affect: Mood normal.        Behavior: Behavior normal.      UC Treatments / Results  Labs (all labs ordered are listed, but only abnormal results are displayed) Labs Reviewed  POCT URINALYSIS DIP (MANUAL ENTRY) - Abnormal; Notable for the following components:      Result Value   Spec Grav, UA <=1.005 (*)    Blood, UA trace-intact (*)    All other components within normal limits  POCT URINE PREGNANCY    EKG   Radiology No results found.  Procedures Procedures (including critical care time)  Medications  Ordered in UC Medications - No data to display  Initial Impression / Assessment and Plan / UC Course  I have reviewed the triage vital signs and the nursing notes.  Pertinent labs & imaging results that were available during my care of the patient were reviewed by me and considered in my medical decision making (see chart for details).  Urinalysis was negative for an obvious urinary tract infection.  Urine culture is pending. Patient encouraged to increase fluids, allow for plenty of rest, and continue use of Tylenol to help with abdominal pain.  Also encouraged a regular toileting schedule for the patient.  Patient was advised that if the culture results do not return prior to her surgery, request that her urine be rechecked by her oncologist.  Differential diagnoses include constipation, abdominal pain, and low back pain.  There is no concern for acute abdomen as patient's vital signs are stable, she is in no acute distress.  Patient was provided supportive care recommendations.  Patient was advised that she will be contacted if the pending culture results are positive.  Patient verbalizes understanding.  All questions were answered.  Patient stable for discharge. Final Clinical Impressions(s) / UC Diagnoses   Final diagnoses:  Lower abdominal pain  Low back pain without sciatica, unspecified back pain laterality, unspecified chronicity     Discharge Instructions      The urinalysis does not indicate a true urinary tract infection.  A urine culture has been ordered.  You will be contacted if the results of the culture are positive. Increase fluids and allow for plenty of rest. Continue use of Tylenol to help with pain, fever, or general discomfort. Recommend toileting every 2 hours. Follow-up in the emergency department immediately if you develop fever, chills, worsening abdominal pain, nausea, vomiting, or other concerns. Follow-up as needed.     ED Prescriptions   None    PDMP  not reviewed this encounter.   Tish Men, NP 01/12/22 1307

## 2022-01-16 HISTORY — PX: HEMIGLOSSECTOMY: SHX1740

## 2022-12-31 LAB — HEPATITIS C ANTIBODY: HCV Ab: NEGATIVE

## 2022-12-31 LAB — OB RESULTS CONSOLE GC/CHLAMYDIA
Chlamydia: NEGATIVE
Neisseria Gonorrhea: NEGATIVE

## 2022-12-31 LAB — OB RESULTS CONSOLE HEPATITIS B SURFACE ANTIGEN: Hepatitis B Surface Ag: NEGATIVE

## 2022-12-31 LAB — OB RESULTS CONSOLE RUBELLA ANTIBODY, IGM: Rubella: IMMUNE

## 2023-04-03 NOTE — L&D Delivery Note (Addendum)
 DELIVERY NOTE  Patient required pitocin at 0134 due to spacing which was titrated per protocol up to 52mU/min. Pt complete and at +2 station with urge to push. Epidural controlling pain. Pt pushed and delivered a viable female infant in LOA position. Loose nuchal x1, reduced at perineum. Anterior and posterior shoulders spontaneously delivered with next two pushes; body easily followed next. Infant placed on mothers abdomen and bulb suction of mouth and nose performed. Cord was then clamped and cut by FOB. Cord blood obtained, 3VC. Baby had a vigorous spontaneous cry noted. Placenta then delivered at 0443 intact. Fundal massage performed and pitocin per protocol. Fundus firm. The following lacerations were noted: NONE, EBL 550cc in quick gush following delivery therefore TXA given. Mother and baby stable. Counts correct   Infant time: 0437 Gender: female Placenta time: 0443 Apgars: 8/9 Weight: pending skin-to-skin  ADDENDUM: Called by RN about large gush after fourth fundal rub. Given time from first dose, TXA re-dosed and IM methergine given. Total QBL at this time now 1132, formal code hemorrhage called and CBC drawn. IV line in place, bladder drained 600cc clear urine. Good tone and fundus firm at modline 2-3cm below umbilicus BP 118/76   Pulse 66   Temp 98.2 F (36.8 C) (Axillary)   Resp 18   Ht 5\' 8"  (1.727 m)   Wt 67.5 kg   SpO2 93%   Breastfeeding Unknown   BMI 22.62 kg/m  Mildly hypotensive but now improving. Sign off at this time, patient stable

## 2023-04-29 LAB — OB RESULTS CONSOLE HIV ANTIBODY (ROUTINE TESTING): HIV: NONREACTIVE

## 2023-04-29 LAB — OB RESULTS CONSOLE RPR: RPR: NONREACTIVE

## 2023-06-04 ENCOUNTER — Non-Acute Institutional Stay (HOSPITAL_COMMUNITY)
Admission: RE | Admit: 2023-06-04 | Discharge: 2023-06-04 | Disposition: A | Discharge status: Home / Self Care | Source: Ambulatory Visit | Attending: Internal Medicine | Admitting: Internal Medicine

## 2023-06-04 DIAGNOSIS — D649 Anemia, unspecified: Secondary | ICD-10-CM | POA: Insufficient documentation

## 2023-06-04 MED ORDER — SODIUM CHLORIDE 0.9 % IV SOLN: INTRAVENOUS | Status: DC | PRN

## 2023-06-04 MED ORDER — FERUMOXYTOL INJECTION 510 MG/17 ML
510.0000 mg | Freq: Once | INTRAVENOUS | Status: AC
  Administered 2023-06-04: 510 mg via INTRAVENOUS
  Filled 2023-06-04: qty 510

## 2023-06-04 NOTE — Progress Notes (Signed)
 PATIENT CARE CENTER NOTE  Diagnosis: D64.9   Provider: Pryor Ochoa, DO  Procedure: Feraheme 510 mg (dose #1 of 2)  Note: Patient received Feraheme infusion via p\iv. No pre-meds per orders. Pt tolerated infusion with no adverse reaction. Pt observed for 30 minutes post infusion. AVS printed and given to pt. Pt advised that she should return to clinic in one week for second dose, and she can schedule at the front desk. Pt is alert, oriented, and ambulatory at discharge

## 2023-06-13 ENCOUNTER — Ambulatory Visit (HOSPITAL_COMMUNITY)
Admission: RE | Admit: 2023-06-13 | Discharge: 2023-06-13 | Disposition: A | Discharge status: Home / Self Care | Source: Ambulatory Visit | Attending: Internal Medicine | Admitting: Internal Medicine

## 2023-06-13 DIAGNOSIS — D649 Anemia, unspecified: Secondary | ICD-10-CM | POA: Insufficient documentation

## 2023-06-13 MED ORDER — SODIUM CHLORIDE 0.9 % IV SOLN
510.0000 mg | Freq: Once | INTRAVENOUS | Status: AC
  Administered 2023-06-13: 510 mg via INTRAVENOUS
  Filled 2023-06-13: qty 17

## 2023-06-13 MED ORDER — SODIUM CHLORIDE 0.9 % IV SOLN: INTRAVENOUS | Status: DC | PRN

## 2023-06-13 NOTE — Progress Notes (Signed)
 PATIENT CARE CENTER NOTE  Diagnosis: D64.9   Provider: Pryor Ochoa, DO  Procedure: Feraheme 510 mg (dose #2 of 2)  Note: Patient received Feraheme 510 mg infusion via PIV. No pre-meds per orders. Patient tolerated infusion with no adverse reaction. Pt observed for 30 minutes post infusion. Vital signs are stable. AVS offered, but pt declined.Pt is alert, oriented, and ambulatory at discharge.

## 2023-07-05 ENCOUNTER — Encounter (HOSPITAL_COMMUNITY): Payer: Self-pay

## 2023-07-05 ENCOUNTER — Inpatient Hospital Stay (HOSPITAL_COMMUNITY)
Admission: AD | Admit: 2023-07-05 | Discharge: 2023-07-05 | Disposition: A | Discharge status: Home / Self Care | Attending: Obstetrics and Gynecology | Admitting: Obstetrics and Gynecology

## 2023-07-05 DIAGNOSIS — Z3A37 37 weeks gestation of pregnancy: Secondary | ICD-10-CM | POA: Insufficient documentation

## 2023-07-05 DIAGNOSIS — Z3493 Encounter for supervision of normal pregnancy, unspecified, third trimester: Secondary | ICD-10-CM

## 2023-07-05 DIAGNOSIS — O479 False labor, unspecified: Secondary | ICD-10-CM

## 2023-07-05 DIAGNOSIS — O471 False labor at or after 37 completed weeks of gestation: Secondary | ICD-10-CM | POA: Insufficient documentation

## 2023-07-05 LAB — POCT FERN TEST: POCT Fern Test: NEGATIVE

## 2023-07-05 LAB — RUPTURE OF MEMBRANE (ROM)PLUS: Rom Plus: POSITIVE

## 2023-07-05 NOTE — MAU Provider Note (Signed)
  S [name] is a 28 y.o. G2P0101 patient who presents to MAU today with complaint of of a gush of fluid > RN assessed the patient initially as a labor check and did a fern slide which was negative and cervical exam performed by RN was. 1.5/50/Posterior @ 1550  O BP 121/81 (BP Location: Right Arm)   Pulse 94   Temp 97.9 F (36.6 C) (Axillary)   Resp 18   Ht 5\' 8"  (1.727 m)   Wt 69.9 kg   SpO2 100%   BMI 23.42 kg/m  Physical Exam Vitals and nursing note reviewed. Exam conducted with a chaperone present.  Constitutional:      Appearance: Normal appearance. She is well-developed.  Cardiovascular:     Rate and Rhythm: Normal rate and regular rhythm.  Pulmonary:     Effort: Pulmonary effort is normal.     Breath sounds: Normal breath sounds.  Abdominal:     Palpations: Abdomen is soft.  Genitourinary:    Exam position: Lithotomy position.     Cervix: Normal. No discharge, lesion or cervical bleeding.  Musculoskeletal:        General: Normal range of motion.     Cervical back: Normal range of motion.  Skin:    General: Skin is warm.  Neurological:     Mental Status: She is alert and oriented to person, place, and time.  Psychiatric:        Mood and Affect: Mood normal.        Behavior: Behavior normal.     FHR : Cat 1 reactive @ 1556 Baseline: 155 Variability moderate  Accelerations: present Decelerations: absent Toco: irregular   Pelvic by Me @ 1737 with RN Chaperone SSE: No pooling, no visualized blood in the vault and cervix appears very posterior and closed ( ROM Plus sent) SVE: performed at 1/50/Posterior @ 1737   MDM  High No evidence of ROM or active labor at this time NST: Cat 1 reactive  I have reviewed the patient chart and performed the physical exam . I have ordered & interpreted the lab results and reviewed and interpreted the NST Medications ordered as stated below.  A/P as described below.  Counseling and education provided and patient  agreeable  with plan as described below. Verbalized understanding.    ASSESSMENT Medical screening exam complete 1. Intact amniotic membranes during pregnancy in third trimester (Primary)  2. False labor  3. [redacted] weeks gestation of pregnancy     PLAN  Discharge from MAU in stable condition Follow up with OB as scheduled See AVS for full description of all instructions and education provided to the patient at discharge Warning signs for worsening condition that would warrant emergency follow-up discussed Patient may return to MAU as needed   Colman Cater, NP 07/05/2023 5:34 PM

## 2023-07-05 NOTE — MAU Note (Signed)
 Patient called out stating she felt a gush of something. RN assessed the patient and collected a fern slide. Patient's perineum was dry with only white mucous noted externally. Fern slide collected. Negative. Sperm noted.

## 2023-07-15 ENCOUNTER — Encounter (HOSPITAL_COMMUNITY): Payer: Self-pay | Admitting: Obstetrics and Gynecology

## 2023-07-15 ENCOUNTER — Inpatient Hospital Stay (HOSPITAL_COMMUNITY)
Admission: AD | Admit: 2023-07-15 | Discharge: 2023-07-18 | DRG: 806 | Disposition: A | Discharge status: Home / Self Care | Payer: Self-pay | Attending: Obstetrics and Gynecology | Admitting: Obstetrics and Gynecology

## 2023-07-15 ENCOUNTER — Inpatient Hospital Stay (HOSPITAL_COMMUNITY): Admitting: Anesthesiology

## 2023-07-15 ENCOUNTER — Other Ambulatory Visit: Payer: Self-pay

## 2023-07-15 DIAGNOSIS — Z833 Family history of diabetes mellitus: Secondary | ICD-10-CM

## 2023-07-15 DIAGNOSIS — Z3A38 38 weeks gestation of pregnancy: Secondary | ICD-10-CM

## 2023-07-15 DIAGNOSIS — O9902 Anemia complicating childbirth: Secondary | ICD-10-CM | POA: Diagnosis present

## 2023-07-15 DIAGNOSIS — Z8249 Family history of ischemic heart disease and other diseases of the circulatory system: Secondary | ICD-10-CM | POA: Diagnosis not present

## 2023-07-15 DIAGNOSIS — O26893 Other specified pregnancy related conditions, third trimester: Secondary | ICD-10-CM | POA: Diagnosis present

## 2023-07-15 DIAGNOSIS — O4292 Full-term premature rupture of membranes, unspecified as to length of time between rupture and onset of labor: Secondary | ICD-10-CM

## 2023-07-15 LAB — CBC
HCT: 37.2 % (ref 36.0–46.0)
Hemoglobin: 12.1 g/dL (ref 12.0–15.0)
MCH: 28.1 pg (ref 26.0–34.0)
MCHC: 32.5 g/dL (ref 30.0–36.0)
MCV: 86.5 fL (ref 80.0–100.0)
Platelets: 262 10*3/uL (ref 150–400)
RBC: 4.3 MIL/uL (ref 3.87–5.11)
WBC: 9.3 10*3/uL (ref 4.0–10.5)
nRBC: 0 % (ref 0.0–0.2)

## 2023-07-15 LAB — OB RESULTS CONSOLE GBS: GBS: NEGATIVE

## 2023-07-15 LAB — POCT FERN TEST: POCT Fern Test: NEGATIVE

## 2023-07-15 LAB — TYPE AND SCREEN
ABO/RH(D): O POS
Antibody Screen: NEGATIVE

## 2023-07-15 LAB — RUPTURE OF MEMBRANE (ROM)PLUS: Rom Plus: POSITIVE

## 2023-07-15 MED ORDER — ACETAMINOPHEN 325 MG PO TABS: 650.0000 mg | ORAL_TABLET | ORAL | Status: DC | PRN

## 2023-07-15 MED ORDER — PHENYLEPHRINE 80 MCG/ML (10ML) SYRINGE FOR IV PUSH (FOR BLOOD PRESSURE SUPPORT)
80.0000 ug | PREFILLED_SYRINGE | INTRAVENOUS | Status: DC | PRN
  Administered 2023-07-16: 80 ug via INTRAVENOUS
  Filled 2023-07-15: qty 10

## 2023-07-15 MED ORDER — EPHEDRINE 5 MG/ML INJ
10.0000 mg | INTRAVENOUS | Status: DC | PRN
  Filled 2023-07-15: qty 5

## 2023-07-15 MED ORDER — PHENYLEPHRINE 80 MCG/ML (10ML) SYRINGE FOR IV PUSH (FOR BLOOD PRESSURE SUPPORT): 80.0000 ug | PREFILLED_SYRINGE | INTRAVENOUS | Status: DC | PRN

## 2023-07-15 MED ORDER — LIDOCAINE HCL (PF) 1 % IJ SOLN
INTRAMUSCULAR | Status: DC | PRN
  Administered 2023-07-15: 5 mL via EPIDURAL

## 2023-07-15 MED ORDER — FLEET ENEMA RE ENEM: 1.0000 | ENEMA | RECTAL | Status: DC | PRN

## 2023-07-15 MED ORDER — FENTANYL-BUPIVACAINE-NACL 0.5-0.125-0.9 MG/250ML-% EP SOLN
12.0000 mL/h | EPIDURAL | Status: DC | PRN
  Administered 2023-07-15: 12 mL/h via EPIDURAL
  Filled 2023-07-15: qty 250

## 2023-07-15 MED ORDER — FAMOTIDINE IN NACL 20-0.9 MG/50ML-% IV SOLN
20.0000 mg | Freq: Once | INTRAVENOUS | Status: AC
  Administered 2023-07-16: 20 mg via INTRAVENOUS
  Filled 2023-07-15: qty 50

## 2023-07-15 MED ORDER — SOD CITRATE-CITRIC ACID 500-334 MG/5ML PO SOLN
30.0000 mL | ORAL | Status: DC | PRN
  Administered 2023-07-15: 30 mL via ORAL
  Filled 2023-07-15: qty 30

## 2023-07-15 MED ORDER — DIPHENHYDRAMINE HCL 50 MG/ML IJ SOLN: 12.5000 mg | INTRAMUSCULAR | Status: DC | PRN

## 2023-07-15 MED ORDER — LACTATED RINGERS IV SOLN: INTRAVENOUS | Status: DC

## 2023-07-15 MED ORDER — OXYCODONE-ACETAMINOPHEN 5-325 MG PO TABS: 1.0000 | ORAL_TABLET | ORAL | Status: DC | PRN

## 2023-07-15 MED ORDER — EPHEDRINE 5 MG/ML INJ
10.0000 mg | INTRAVENOUS | Status: DC | PRN
  Administered 2023-07-16: 10 mg via INTRAVENOUS

## 2023-07-15 MED ORDER — LACTATED RINGERS IV SOLN: 500.0000 mL | Freq: Once | INTRAVENOUS | Status: DC

## 2023-07-15 MED ORDER — LIDOCAINE HCL (PF) 1 % IJ SOLN: 30.0000 mL | INTRAMUSCULAR | Status: DC | PRN

## 2023-07-15 MED ORDER — LACTATED RINGERS IV SOLN
500.0000 mL | INTRAVENOUS | Status: DC | PRN
  Administered 2023-07-16: 1000 mL via INTRAVENOUS

## 2023-07-15 MED ORDER — ONDANSETRON HCL 4 MG/2ML IJ SOLN
4.0000 mg | Freq: Four times a day (QID) | INTRAMUSCULAR | Status: DC | PRN
  Administered 2023-07-16: 4 mg via INTRAVENOUS
  Filled 2023-07-15: qty 2

## 2023-07-15 MED ORDER — OXYCODONE-ACETAMINOPHEN 5-325 MG PO TABS: 2.0000 | ORAL_TABLET | ORAL | Status: DC | PRN

## 2023-07-15 MED ORDER — OXYTOCIN BOLUS FROM INFUSION
333.0000 mL | Freq: Once | INTRAVENOUS | Status: AC
  Administered 2023-07-16: 333 mL via INTRAVENOUS

## 2023-07-15 MED ORDER — OXYTOCIN-SODIUM CHLORIDE 30-0.9 UT/500ML-% IV SOLN: 2.5000 [IU]/h | INTRAVENOUS | Status: DC

## 2023-07-15 NOTE — Anesthesia Procedure Notes (Signed)
 Epidural Patient location during procedure: OB Start time: 07/15/2023 11:12 PM End time: 07/15/2023 11:26 PM  Staffing Anesthesiologist: Rosalita Combe, MD Performed: anesthesiologist   Preanesthetic Checklist Completed: patient identified, IV checked, site marked, risks and benefits discussed, surgical consent, monitors and equipment checked, pre-op evaluation and timeout performed  Epidural Patient position: sitting Prep: DuraPrep and site prepped and draped Patient monitoring: continuous pulse ox and blood pressure Approach: midline Location: L3-L4 Injection technique: LOR air  Needle:  Needle type: Tuohy  Needle gauge: 17 G Needle length: 9 cm and 9 Needle insertion depth: 5 cm cm Catheter type: closed end flexible Catheter size: 19 Gauge Catheter at skin depth: 11 cm Test dose: negative  Assessment Events: blood not aspirated, no cerebrospinal fluid, injection not painful, no injection resistance, no paresthesia and negative IV test  Additional Notes Patient identified. Risks/Benefits/Options discussed with patient including but not limited to bleeding, infection, nerve damage, paralysis, failed block, incomplete pain control, headache, blood pressure changes, nausea, vomiting, reactions to medication both or allergic, itching and postpartum back pain. Confirmed with bedside nurse the patient's most recent platelet count. Confirmed with patient that they are not currently taking any anticoagulation, have any bleeding history or any family history of bleeding disorders. Patient expressed understanding and wished to proceed. All questions were answered. Sterile technique was used throughout the entire procedure. Please see nursing notes for vital signs. Test dose was given through epidural needle and negative prior to continuing to dose epidural or start infusion. Warning signs of high block given to the patient including shortness of breath, tingling/numbness in hands, complete  motor block, or any concerning symptoms with instructions to call for help. Patient was given instructions on fall risk and not to get out of bed. All questions and concerns addressed with instructions to call with any issues. 1 Attempt (S) . Patient tolerated procedure well.

## 2023-07-15 NOTE — MAU Note (Addendum)
 Per patient, She was sent home with a "false positive SROM"  on 07/05/23.   When the patient later attended her prenatal checkup, she was questioned about why, according to our records, she had been sent home after testing positive for SROM. The patient states that in order to ensure that her membranes weren't ruptured, her provider performed three further tests that day. That day, the test result was negative.    Today, pt reports leaking of fluid.   ROM plus- ordered and pending.

## 2023-07-15 NOTE — MAU Note (Signed)
 Pt says she went to Dr Felipe Horton today - VE- 2 cm Now has some spotting.  Here for UC's   7/10 Denies HSV GBS- neg

## 2023-07-15 NOTE — Progress Notes (Signed)
 BP 132/79   Pulse 89   Temp 97.9 F (36.6 C) (Oral)   Resp 16   Ht 5\' 8"  (1.727 m)   Wt 67.5 kg   SpO2 98%   BMI 22.62 kg/m  Patient endorses painful contractions, contemplating epidural, still occ leaking CE unchanged 4/60/-2, tense forebag noted TOCO q2-4m, Cat 1 tracing with baseline 135, +accels, no decels, mod var  Offered AROM, pt wants to defer, is leaning towards epidural at this time prior to AROM. Will notify, anticipate SVD

## 2023-07-15 NOTE — H&P (Signed)
 Rebecca Valdez is a 28 y.o. female presenting for evaluation of contracitons worsening since membrane sweep this AM. Denies VB. CTX now q2-78m and more painful was 2cm in office. +FM. Notes irregular small gushes of fluid. 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 s/p partial glossectomy, hemoglosectomy, SLND, skin grafting and radiation. 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 Social History:  reports that she has never smoked. She has been exposed to tobacco smoke. She has never used smokeless tobacco. She reports that she does not currently use alcohol. She reports that she does not use drugs.     Maternal Diabetes: No1hr 52 Genetic Screening: Declined Maternal Ultrasounds/Referrals: Normal Fetal Ultrasounds or other Referrals:  None Maternal Substance Abuse:  No Significant Maternal Medications:  None Significant Maternal Lab Results:  Group B Strep negative Number of Prenatal Visits:greater than 3 verified prenatal visits Maternal  Vaccinations:TDap Other Comments:  None  Review of Systems  Constitutional:  Negative for chills and fever.  Respiratory:  Negative for shortness of breath.   Cardiovascular:  Negative for chest pain, palpitations and leg swelling.  Gastrointestinal:  Negative for abdominal pain, nausea and vomiting.  Genitourinary:  Positive for vaginal discharge.  Neurological:  Negative for dizziness, weakness and headaches.  Psychiatric/Behavioral:  Negative for suicidal ideas.    Maternal Medical History:  Reason for admission: Contractions.  Nausea.  Contractions: Onset was 1-2 hours ago.   Frequency: regular.     Dilation: 4 (Pt states she had to poop. RN checked pt cervix before bathroom use) Effacement (%): 60 Station: -2 Exam by:: Eustacio Highman, RN Blood pressure 127/82, pulse 89, temperature 97.6 F (36.4 C), temperature source Oral, resp. rate 14, height 5\' 8"  (1.727 Rebecca), weight 67.5 kg, SpO2 98%. Exam Physical Exam Constitutional:      General: She is not in acute distress.    Appearance: She is well-developed.  HENT:     Head: Normocephalic and atraumatic.  Eyes:     Pupils: Pupils are equal, round, and reactive to light.  Cardiovascular:     Rate and Rhythm: Normal rate and regular rhythm.     Heart sounds: No murmur heard.    No gallop.  Abdominal:     Tenderness: There is no abdominal tenderness.  There is no guarding or rebound.  Genitourinary:    Vagina: Normal.  Musculoskeletal:        General: Normal range of motion.     Cervical back: Normal range of motion and neck supple.  Skin:    General: Skin is warm and dry.  Neurological:     Mental Status: She is alert and oriented to person, place, and time.     Prenatal labs: ABO, Rh: --/--/PENDING (04/14 2005)Opos Antibody: PENDING (04/14 2005)neg Rubella:  im RPR:   nr HBsAg:   neg HIV:   nr GBS:   NEG Variciella NI  Cat 1 tracing, baseline 150, mod var, + accels, no decels TOCO  q279m  Assessment/Plan: This is a 27yo G2P0101 @ 38 3/7 by LMP c/w 10wk scan admitted in latent labor with cervical change and ROM plus with intermittent discharge c/f slow amniotic fluid leak. Will admit, epidural when desires, AROM and augment as needed. GBS neg. Pelvis proven to 5lb13oz (prior 36 5/7 delivery)   Rebecca Valdez Rebecca Valdez 07/15/2023, 9:30 PM

## 2023-07-15 NOTE — Anesthesia Preprocedure Evaluation (Addendum)
 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 s/p partial glossectomy, hemoglosectomy, SLND, skin grafting and radiation. Dental no notable dental hx. (+) Teeth Intact, Dental Advisory Given   Pulmonary neg pulmonary ROS   Pulmonary exam normal breath sounds clear to auscultation       Cardiovascular negative cardio ROS Normal cardiovascular exam Rhythm:Regular Rate:Normal     Neuro/Psych negative neurological ROS  negative psych ROS   GI/Hepatic negative GI ROS, Neg liver ROS,,,  Endo/Other  negative endocrine ROS    Renal/GU negative Renal ROS  negative genitourinary   Musculoskeletal negative musculoskeletal ROS (+)    Abdominal   Peds  Hematology Lab Results      Component                Value               Date                      WBC                      9.3                 07/15/2023                HGB                      12.1                07/15/2023                HCT                      37.2                07/15/2023                MCV                      86.5                07/15/2023                PLT                      262                 07/15/2023              Anesthesia Other Findings   Reproductive/Obstetrics (+) Pregnancy                             Anesthesia Physical Anesthesia Plan  ASA: 2  Anesthesia Plan: Epidural   Post-op Pain Management:    Induction:   PONV Risk Score and Plan:   Airway Management Planned:   Additional Equipment:   Intra-op Plan:   Post-operative Plan:   Informed Consent: I have reviewed the patients History and Physical, chart,  labs and discussed the procedure including the risks,  benefits and alternatives for the proposed anesthesia with the patient or authorized representative who has indicated his/her understanding and acceptance.       Plan Discussed with:   Anesthesia Plan Comments: (38.3 wk G2P1 for LEA)        Anesthesia Quick Evaluation

## 2023-07-15 NOTE — MAU Provider Note (Signed)
 S: [name] is a 28 y.o. G2P0101 at [redacted]w[redacted]d  who presents to MAU today complaining of leaking of fluid since being in MAU today. She was seen on 07/05/2023 for leaking fluid after SI. She was d/c'd home after being told tests for ROM were negative. She was seen by Dr. Felipe Horton on 07/09/2023 where "3 negative tests and a reactive NST were performed. She was seen in the office today; cervix was dilated 2 cm and "baby is really low." She denies vaginal bleeding. She endorses contractions every 3-4 mins since 0915 this morning. She denies any fever or foul smelling vaginal discharge. She reports normal fetal movement. She receives [facility] with [facility]. Her spouse is present and contributing to the history taking.    O: BP 127/82   Pulse 89   Temp 97.6 F (36.4 C) (Oral)   Resp 14   Ht 5\' 8"  (1.727 m)   Wt 67.5 kg   SpO2 98%   BMI 22.62 kg/m  GENERAL: Well-developed, well-nourished female in no acute distress.  HEAD: Normocephalic, atraumatic.  CHEST: Normal effort of breathing, regular heart rate ABDOMEN: Soft, nontender, gravid PELVIC: ROM+ swab collected by RN using blind swab technique --> swab collected prior to CNM going in room  Cervical exam:  Dilation: 4 Effacement (%): 60 Cervical Position: Middle Station: -2 Presentation: Vertex Exam by:: Eustacio Highman, RN (Pt states she has to poop. RN recheck pt before use)   Fetal Monitoring: Baseline: 145 Variability: moderate Accelerations: present Decelerations: absent Contractions: regular every 1.5-4 mins apart  Results for orders placed or performed during the hospital encounter of 07/15/23 (from the past 24 hours)  Fern Test     Status: None   Collection Time: 07/15/23  7:50 PM  Result Value Ref Range   POCT Fern Test Negative = intact amniotic membranes   Type and screen [facility]     Status: None (Preliminary result)   Collection Time: 07/15/23  8:05 PM  Result Value Ref Range   ABO/RH(D) PENDING     Antibody Screen PENDING    Sample Expiration      07/18/2023,2359 Performed at [facility], [address], [place], [place] [address]   Rupture of Membrane (ROM) Plus     Status: None   Collection Time: 07/15/23  8:16 PM  Result Value Ref Range   Rom Plus POSITIVE     A: SIUP at [redacted]w[redacted]d  SROM  P: Report given to RN to contact MD on call for admission orders  Almond Army, CNM 07/15/2023, 9:05 PM

## 2023-07-16 ENCOUNTER — Encounter (HOSPITAL_COMMUNITY): Payer: Self-pay | Admitting: Obstetrics and Gynecology

## 2023-07-16 LAB — CBC
HCT: 41.9 % (ref 36.0–46.0)
Hemoglobin: 13.7 g/dL (ref 12.0–15.0)
MCH: 28.6 pg (ref 26.0–34.0)
MCHC: 32.7 g/dL (ref 30.0–36.0)
MCV: 87.5 fL (ref 80.0–100.0)
Platelets: 224 10*3/uL (ref 150–400)
RBC: 4.79 MIL/uL (ref 3.87–5.11)
WBC: 12.7 10*3/uL — ABNORMAL HIGH (ref 4.0–10.5)
nRBC: 0 % (ref 0.0–0.2)

## 2023-07-16 LAB — RPR: RPR Ser Ql: NONREACTIVE

## 2023-07-16 MED ORDER — ONDANSETRON HCL 4 MG PO TABS: 4.0000 mg | ORAL_TABLET | ORAL | Status: DC | PRN

## 2023-07-16 MED ORDER — SENNOSIDES-DOCUSATE SODIUM 8.6-50 MG PO TABS
2.0000 | ORAL_TABLET | Freq: Every day | ORAL | Status: DC
  Administered 2023-07-17 – 2023-07-18 (×2): 2 via ORAL
  Filled 2023-07-16 (×2): qty 2

## 2023-07-16 MED ORDER — METHYLERGONOVINE MALEATE 0.2 MG/ML IJ SOLN
INTRAMUSCULAR | Status: AC
  Filled 2023-07-16: qty 1

## 2023-07-16 MED ORDER — BENZOCAINE-MENTHOL 20-0.5 % EX AERO: 1.0000 | INHALATION_SPRAY | CUTANEOUS | Status: DC | PRN

## 2023-07-16 MED ORDER — IBUPROFEN 600 MG PO TABS
600.0000 mg | ORAL_TABLET | Freq: Four times a day (QID) | ORAL | Status: DC
  Administered 2023-07-16 – 2023-07-18 (×9): 600 mg via ORAL
  Filled 2023-07-16 (×9): qty 1

## 2023-07-16 MED ORDER — TETANUS-DIPHTH-ACELL PERTUSSIS 5-2.5-18.5 LF-MCG/0.5 IM SUSY: 0.5000 mL | PREFILLED_SYRINGE | Freq: Once | INTRAMUSCULAR | Status: DC

## 2023-07-16 MED ORDER — TRANEXAMIC ACID-NACL 1000-0.7 MG/100ML-% IV SOLN
INTRAVENOUS | Status: AC
  Filled 2023-07-16: qty 100

## 2023-07-16 MED ORDER — OXYTOCIN-SODIUM CHLORIDE 30-0.9 UT/500ML-% IV SOLN
1.0000 m[IU]/min | INTRAVENOUS | Status: DC
Start: 2023-07-16 — End: 2023-07-16
  Administered 2023-07-16: 2 m[IU]/min via INTRAVENOUS
  Filled 2023-07-16: qty 500

## 2023-07-16 MED ORDER — ONDANSETRON HCL 4 MG/2ML IJ SOLN: 4.0000 mg | INTRAMUSCULAR | Status: DC | PRN

## 2023-07-16 MED ORDER — COCONUT OIL OIL: 1.0000 | TOPICAL_OIL | Status: DC | PRN

## 2023-07-16 MED ORDER — DIPHENHYDRAMINE HCL 25 MG PO CAPS: 25.0000 mg | ORAL_CAPSULE | Freq: Four times a day (QID) | ORAL | Status: DC | PRN

## 2023-07-16 MED ORDER — METHYLERGONOVINE MALEATE 0.2 MG/ML IJ SOLN
0.2000 mg | Freq: Once | INTRAMUSCULAR | Status: AC
  Administered 2023-07-16: 0.2 mg via INTRAMUSCULAR

## 2023-07-16 MED ORDER — TERBUTALINE SULFATE 1 MG/ML IJ SOLN: 0.2500 mg | Freq: Once | INTRAMUSCULAR | Status: DC | PRN

## 2023-07-16 MED ORDER — WITCH HAZEL-GLYCERIN EX PADS: 1.0000 | MEDICATED_PAD | CUTANEOUS | Status: DC | PRN

## 2023-07-16 MED ORDER — PRENATAL MULTIVITAMIN CH
1.0000 | ORAL_TABLET | Freq: Every day | ORAL | Status: DC
  Administered 2023-07-16 – 2023-07-18 (×3): 1 via ORAL
  Filled 2023-07-16 (×3): qty 1

## 2023-07-16 MED ORDER — TRANEXAMIC ACID-NACL 1000-0.7 MG/100ML-% IV SOLN
1000.0000 mg | INTRAVENOUS | Status: AC
  Administered 2023-07-16: 1000 mg via INTRAVENOUS

## 2023-07-16 MED ORDER — SIMETHICONE 80 MG PO CHEW: 80.0000 mg | CHEWABLE_TABLET | ORAL | Status: DC | PRN

## 2023-07-16 MED ORDER — LACTATED RINGERS IV SOLN: INTRAVENOUS | Status: AC

## 2023-07-16 MED ORDER — ACETAMINOPHEN 325 MG PO TABS
650.0000 mg | ORAL_TABLET | ORAL | Status: DC | PRN
  Administered 2023-07-17 – 2023-07-18 (×2): 650 mg via ORAL
  Filled 2023-07-16 (×2): qty 2

## 2023-07-16 MED ORDER — LACTATED RINGERS IV BOLUS
1000.0000 mL | Freq: Once | INTRAVENOUS | Status: AC
  Administered 2023-07-16: 1000 mL via INTRAVENOUS

## 2023-07-16 MED ORDER — ZOLPIDEM TARTRATE 5 MG PO TABS: 5.0000 mg | ORAL_TABLET | Freq: Every evening | ORAL | Status: DC | PRN

## 2023-07-16 MED ORDER — TRANEXAMIC ACID-NACL 1000-0.7 MG/100ML-% IV SOLN
1000.0000 mg | Freq: Once | INTRAVENOUS | Status: AC
  Administered 2023-07-16: 1000 mg via INTRAVENOUS
  Filled 2023-07-16: qty 100

## 2023-07-16 MED ORDER — DIBUCAINE (PERIANAL) 1 % EX OINT: 1.0000 | TOPICAL_OINTMENT | CUTANEOUS | Status: DC | PRN

## 2023-07-16 NOTE — Progress Notes (Signed)
 Patient seen and examined.  Eating breakfast.  Reports she is feeling much better.  No longer feeling dizzy or nauseous.  Has yet do get out of bed (legs still heavy from epidural), but sitting up  BP (!) 99/58 (BP Location: Left Arm)   Pulse 65   Temp (!) 97.5 F (36.4 C) (Axillary)   Resp 18   Ht 5\' 8"  (1.727 m)   Wt 67.5 kg   SpO2 98%   Breastfeeding Unknown   BMI 22.62 kg/m   NAD Abdomen soft, fundus firm EXt: no LE edema     Latest Ref Rng & Units 07/16/2023    6:40 AM 07/15/2023    8:05 PM 12/08/2020    5:52 AM  CBC  WBC 4.0 - 10.5 K/uL 12.7  9.3  14.6   Hemoglobin 12.0 - 15.0 g/dL 16.1  09.6  04.5   Hematocrit 36.0 - 46.0 % 41.9  37.2  33.2   Platelets 150 - 400 K/uL 224  262  263    Repeat CBC after code hemorrhage shows a hemoglobin of 13.7; UP from 12.1.  This likely represents hemoconcentration from dehydration / labor.  Patient has been drinking by mouth and receiving IVF.  BPs at baseline from labor (systolic 90s-100s)  Will hold off on repeating CBC at this time.  Will allow patient to get out of bed and ambulate this afternoon.  If symptoms return, consider repeat cbc at that time; otherwise, repeat in AM

## 2023-07-16 NOTE — Anesthesia Postprocedure Evaluation (Signed)
 Anesthesia Post Note  Patient: Altie Savard  Procedure(s) Performed: AN AD HOC LABOR EPIDURAL     Patient location during evaluation: Mother Baby Anesthesia Type: Epidural Level of consciousness: awake and alert Pain management: pain level controlled Vital Signs Assessment: post-procedure vital signs reviewed and stable Respiratory status: spontaneous breathing, nonlabored ventilation and respiratory function stable Cardiovascular status: stable Postop Assessment: no headache, no backache and epidural receding Anesthetic complications: no   No notable events documented.  Last Vitals:  Vitals:   07/16/23 0911 07/16/23 1314  BP: (!) 99/58 101/73  Pulse: 65 81  Resp: 18 18  Temp: (!) 36.4 C 36.7 C  SpO2: 98% 97%    Last Pain:  Vitals:   07/16/23 1314  TempSrc: Axillary  PainSc: 1    Pain Goal:                   Duran Ohern

## 2023-07-16 NOTE — Progress Notes (Signed)
 Labor Note  S: comfortable s/p epidural, amenable to AROm  O: BP 124/70   Pulse 95   Temp 97.9 F (36.6 C) (Oral)   Resp 16   Ht 5\' 8"  (1.727 m)   Wt 67.5 kg   SpO2 98%   BMI 22.62 kg/m  CE: 5-6/80/-1, clear Arom of forebag @ 0006 FHR: Baseline 145, +accels, -decels, mod variability TOCO q3-4  A/P: This is a 28 y.o. G2P0101 at [redacted]w[redacted]d  admitted in latent labor with +ROM plus FWB: cat 1 MWB: comfortable s/p epidural Labor course: transitioning through latent to active labor. Aware that, if contractions space out in next hour, will augment with pitocin.Pt amenable   Anticipate SVD

## 2023-07-16 NOTE — Lactation Note (Addendum)
 This note was copied from a baby's chart. Lactation Consultation Note  Patient Name: Rebecca Valdez JOACZ'Y Date: 07/16/2023 Age:29 hours  P2, [redacted]w[redacted]d, Breast and formula feeding  Mother declines lactation consultation per her nurse. She informed her nurse that if she needs help from lactation, she will request.     Consult Status Complete ( declined LC services)     [name] M 07/16/2023, 1:54 PM

## 2023-07-17 LAB — CBC
HCT: 31.8 % — ABNORMAL LOW (ref 36.0–46.0)
Hemoglobin: 10.4 g/dL — ABNORMAL LOW (ref 12.0–15.0)
MCH: 28.5 pg (ref 26.0–34.0)
MCHC: 32.7 g/dL (ref 30.0–36.0)
MCV: 87.1 fL (ref 80.0–100.0)
Platelets: ADEQUATE 10*3/uL (ref 150–400)
RBC: 3.65 MIL/uL — ABNORMAL LOW (ref 3.87–5.11)
WBC: 9.4 10*3/uL (ref 4.0–10.5)
nRBC: 0 % (ref 0.0–0.2)

## 2023-07-17 MED ORDER — FAMOTIDINE 20 MG PO TABS
20.0000 mg | ORAL_TABLET | Freq: Two times a day (BID) | ORAL | Status: DC | PRN
  Administered 2023-07-17: 20 mg via ORAL
  Filled 2023-07-17: qty 1

## 2023-07-17 NOTE — Progress Notes (Signed)
 Post Partum Day 1 Subjective: Doing well. Breastfeeding without difficulty. Pain controlled. Lochia light  Baby on phototherapy  Objective: Blood pressure (!) 92/59, pulse 69, temperature 97.9 F (36.6 C), temperature source Oral, resp. rate 18, height 5\' 8"  (1.727 m), weight 67.5 kg, SpO2 91%, unknown if currently breastfeeding.  Physical Exam:  General: alert, cooperative, and no distress Lochia: appropriate Uterine Fundus: firm Incision: N/A DVT Evaluation: No evidence of DVT seen on physical exam. No significant calf/ankle edema.  Recent Labs    07/16/23 0640 07/17/23 0712  HGB 13.7 10.4*  HCT 41.9 31.8*    Assessment/Plan: 28 yo G2P1102 PPD#1 s/p NSVD  - PP: Doing well - Postpartum hemorrhage: Hb 12-->10 - Rh positive - Dispo: DC home tomorrow given infant phototherapy needs  LOS: 2 days   Leanne Pronto, DO 07/17/2023, 10:52 AM

## 2023-07-18 ENCOUNTER — Ambulatory Visit (HOSPITAL_COMMUNITY): Payer: Self-pay

## 2023-07-18 MED ORDER — IBUPROFEN 800 MG PO TABS: 800.0000 mg | ORAL_TABLET | Freq: Three times a day (TID) | ORAL | 1 refills | Status: AC | PRN

## 2023-07-18 NOTE — Progress Notes (Signed)
 Post Partum Day 2 Subjective: Doing well. Breastfeeding without difficulty. Pain controlled. Lochia light  Baby on phototherapy still.   Objective: Blood pressure (!) 101/58, pulse 62, temperature 97.9 F (36.6 C), temperature source Oral, resp. rate 18, height 5\' 8"  (1.727 m), weight 67.5 kg, SpO2 99%, unknown if currently breastfeeding.  Physical Exam:  General: alert, cooperative, and no distress Lochia: appropriate Uterine Fundus: firm Incision: N/A DVT Evaluation: No evidence of DVT seen on physical exam. No significant calf/ankle edema.  Recent Labs    07/16/23 0640 07/17/23 0712  HGB 13.7 10.4*  HCT 41.9 31.8*    Assessment/Plan: 28 yo G2P1102 PPD#2 s/p NSVD  - PP: Doing well - Postpartum hemorrhage: Hb 12-->10, asymptomatic at this time - Rh positive - Dispo: DC home today, may room in if baby requires additional phototherapy   LOS: 3 days   Gaspar Karma, MD 07/18/2023, 9:17 AM

## 2023-07-18 NOTE — Lactation Note (Signed)
 This note was copied from a baby's chart. Lactation Consultation Note  Patient Name: Rebecca Valdez [id number] Date: 07/18/2023 Age:29 hours Reason for consult: Follow-up assessment;Early term 37-38.6wks;Hyperbilirubinemia (change in weight loss -5,21 to -5.91 in past 24 hours. Infant is on triple phototherapy and currently breast and formula feeding.)  MOB had infant latched on her left breast using the cradle hold, infant was actively breastfeeding swallows were observed, infant breastfeed for 16 minutes. MOB was set up with DEBP, given 18 mm breast flange and was still expressing colostrum when LC left the room, had expressed 8 mls as LC left the room. MOB plans to supplement infant with her own EBM first and then offer formula if needed. MOB knows on Day 3 to supplement infant with 18-25 mls per feeding. MOB knows to call if she has any BF questions or concerns. MOB informed LC her son was in the hospital on billi lights for 6 days when he was born, he is currently 33/29 years old.   Current feeding plan: 1- MOB will continue to breastfeed infant by cues, on demand, 8-12 times, skin to skin. 2- MOB will continue to supplement infant with any EBM first and then formula day 3 ( 18-21 mls) until billi level is resolved and infant has regain birth weight. 3- MOB will continue to use the DEBP every 3 hours for  15 minutes on initial setting.    Maternal Data    Feeding Mother's Current Feeding Choice: Breast Milk and Formula  LATCH Score Latch: Grasps breast easily, tongue down, lips flanged, rhythmical sucking.  Audible Swallowing: Spontaneous and intermittent  Type of Nipple: Everted at rest and after stimulation  Comfort (Breast/Nipple): Soft / non-tender  Hold (Positioning): No assistance needed to correctly position infant at breast.  LATCH Score: 10   Lactation Tools Discussed/Used Tools: Flanges;Pump Flange Size: 18 Breast pump type: Double-Electric Breast Pump Pump  Education: Setup, frequency, and cleaning;Milk Storage Reason for Pumping: Infant on phototherapy Pumping frequency: MOB will continue to use DEBP every 3 hours for 15 minutes on inital setting. Pumped volume: 8 mL (MOB was still expressing colostrum when LC left the room.)  Interventions    Discharge Pump: DEBP;Personal  Consult Status Consult Status: Follow-up Date: 07/19/23 Follow-up type: In-patient    Pecolia Bourbon 07/18/2023, 9:04 PM

## 2023-07-18 NOTE — Discharge Summary (Signed)
 Postpartum Discharge Summary  Date of Service updated     Patient Name: Rebecca Valdez DOB: [birth year] MRN: [id number]  Date of admission: 07/15/2023 Delivery date:07/16/2023 Delivering provider: Carlisle Cater Date of discharge: 07/18/2023  Admitting diagnosis: Normal labor [O80, Z37.9] Intrauterine pregnancy: [redacted]w[redacted]d     Secondary diagnosis:  Principal Problem:   Normal labor Active Problems:   [redacted] weeks gestation of pregnancy   Indication for care in labor or delivery  Additional problems: postpartum hemorrhage    Discharge diagnosis: Term Pregnancy Delivered and PPH                                              Post partum procedures: none Augmentation: AROM and Pitocin Complications: Hemorrhage>1075mL  Hospital course: Onset of Labor With Vaginal Delivery      28 y.o. yo [id number] at [redacted]w[redacted]d was admitted in Latent Labor on 07/15/2023. Labor course was complicated by none  Membrane Rupture Time/Date: 12:06 AM,07/16/2023  Delivery Method:Vaginal, Spontaneous Episiotomy: None Lacerations:  None Patient had a postpartum course complicated by [facility] following delivery. Received TXA x2 and IM methergine .  She is ambulating, tolerating a regular diet, passing flatus, and urinating well. Patient is discharged home in stable condition on 07/18/23.  Newborn Data: Birth date:07/16/2023 Birth time:4:37 AM Gender:Female Living status:Living Apgars:8 ,9  Weight:3450 g  Immunizations administered: Immunization History  Administered Date(s) Administered   Meningococcal Conjugate 08/02/2006   Td 08/02/2006   Tdap 08/02/2006    Physical exam  Vitals:   07/16/23 1816 07/17/23 0537 07/17/23 1222 07/17/23 2030  BP: 97/71 (!) 92/59 91/62 (!) 101/58  Pulse: 87 69 60 62  Resp: 18 18 18 18   Temp: 97.7 F (36.5 C) 97.9 F (36.6 C) 97.7 F (36.5 C) 97.9 F (36.6 C)  TempSrc: Axillary Oral Oral Oral  SpO2: 97% 91% 99% 99%  Weight:      Height:       General: alert, cooperative, and  no distress Lochia: appropriate Uterine Fundus: firm Incision: N/A DVT Evaluation: No evidence of DVT seen on physical exam. Negative Homan's sign. No cords or calf tenderness. Labs: Lab Results  Component Value Date   WBC 9.4 07/17/2023   HGB 10.4 (L) 07/17/2023   HCT 31.8 (L) 07/17/2023   MCV 87.1 07/17/2023   PLT  07/17/2023    PLATELET CLUMPS NOTED ON SMEAR, COUNT APPEARS ADEQUATE       No data to display         Edinburgh Score:    07/17/2023    7:27 AM  [name] Postnatal Depression Scale Screening Tool  I have been able to laugh and see the funny side of things. 0  I have looked forward with enjoyment to things. 0  I have blamed myself unnecessarily when things went wrong. 2  I have been anxious or worried for no good reason. 1  I have felt scared or panicky for no good reason. 1  Things have been getting on top of me. 1  I have been so unhappy that I have had difficulty sleeping. 1  I have felt sad or miserable. 0  I have been so unhappy that I have been crying. 0  The thought of harming myself has occurred to me. 0  Edinburgh Postnatal Depression Scale Total 6      After visit meds:  Allergies  as of 07/18/2023   No Known Allergies      Medication List     STOP taking these medications    ferrous sulfate 325 (65 FE) MG EC tablet   gabapentin 100 MG capsule Commonly known as: NEURONTIN       TAKE these medications    acetaminophen 500 MG tablet Commonly known as: TYLENOL Take by mouth.   ibuprofen 800 MG tablet Commonly known as: ADVIL Take 1 tablet (800 mg total) by mouth every 8 (eight) hours as needed.   prenatal multivitamin Tabs tablet Take 1 tablet by mouth daily at 12 noon.         Discharge home in stable condition Infant Feeding: Breast Infant Disposition:home with mother Discharge instruction: per After Visit Summary and Postpartum booklet. Activity: Advance as tolerated. Pelvic rest for 6 weeks.  Diet: routine  diet Anticipated Birth Control: Unsure Postpartum Appointment:6 weeks Future Appointments:No future appointments. Follow up Visit:[facility]   07/18/2023 Gaspar Karma, MD

## 2023-07-19 ENCOUNTER — Ambulatory Visit (HOSPITAL_COMMUNITY): Payer: Self-pay

## 2023-07-19 NOTE — Lactation Note (Signed)
 This note was copied from a baby's chart. Lactation Consultation Note  Patient Name: Rebecca Valdez Unijb'd Date: 07/19/2023 Age:29 hours Reason for consult: Early term 37-38.6wks;Hyperbilirubinemia  P2, 38 wks, @ 78 hrs of life. Mom feeding baby pumped colostrum with LC arrival. Per mom pumping 35 ml- encouraged mom that's awesome.  Encouraged mom couplet is doing great through the challenges of photo-therapy. Mom anticipates one more day here.  Encouraged mom to keep working on big mouth latch with baby- per mom last LC said baby was latching well- and baby has been opening mouth well. Encouraged use EBM or coconut oil after each feed or with pumping. Discussed cluster feeding overnight/ early morning brings in our milk supply, shared expectations of milk coming in. Highlighted risk of engorgement. Discussed hand pump/express to soften breasts, motrin  as anti-inflammatory, and ice packs for 10-20 minutes post feed/pumping if still over-full is the best treatments for inflamed/engorged breasts.   Feeding Mother's Current Feeding Choice: Breast Milk and Formula   Lactation Tools Discussed/Used Breast pump type: Double-Electric Breast Pump;Manual  Interventions Interventions: Hand express;Breast compression;Expressed milk;Coconut oil;Hand pump;DEBP;Education  Discharge Discharge Education: Engorgement and breast care Pump: DEBP;Manual;Personal  Consult Status Consult Status: Follow-up Date: 07/20/23 Follow-up type: In-patient    [facility] 07/19/2023, 10:49 AM

## 2023-07-19 NOTE — Lactation Note (Signed)
 This note was copied from a baby's chart. Lactation Consultation Note  Patient Name: Rebecca Valdez NFAOZ'[id number] Date: 07/19/2023 Age:29 days Reason for consult: Follow-up assessment;Early term 37-38.6wks (Infant weight loss -6.52% today) Per MOB, infant is still on billi lights, infant is now breastfeeding well, recently breastfeed for 25 minutes at 2100 pm and afterwards was given 15 mls of EBM. MOB is latching infant first every feeding and then supplement infant. Per MOB, she has supplemented infant twice today with formula but has mostly been using her own EBM most feeding, plans to continue to supplement infant with EBM until juandice is resolved. Infant had 3 stools and 6 voids today. MOB  pumped 4 times today. MOB continue to latch infant every feeding and knows to pump if infant does not latch at the breast  or if feeling breast  fullness or discomfort to prevent engorgement. Per MOB, once pumping session she expressed 4 ounces. MOB does not have any breastfeeding questions or concerns for LC at this time.  Maternal Data    Feeding Mother's Current Feeding Choice: Breast Milk and Formula  LATCH Score  LC did not observe latch due infant recently being breastfeed and supplemented with EBM.                  Lactation Tools Discussed/Used    Interventions Interventions: Expressed milk;Education;Adjust position;Pace feeding  Discharge    Consult Status Consult Status: Follow-up Date: 07/20/23 Follow-up type: In-patient    Pecolia Bourbon 07/19/2023, 9:58 PM

## 2023-07-24 ENCOUNTER — Telehealth (HOSPITAL_COMMUNITY): Payer: Self-pay | Admitting: *Deleted

## 2023-07-24 NOTE — Telephone Encounter (Signed)
 07/24/2023  Name: Rebecca Valdez MRN: [id number] DOB: 1995-10-22  Reason for Call:  Transition of Care Hospital Discharge Call  Contact Status: Patient Contact Status: Message  Language assistant needed:          Follow-Up Questions:    Dimple Francis Postnatal Depression Scale:  In the Past 7 Days:    PHQ2-9 Depression Scale:     Discharge Follow-up:    Post-discharge interventions: NA  Pearlie Bougie, RN 07/24/2023 15:09

## 2023-09-19 IMAGING — CT CT CHEST W/ CM
1 series · 15 of 34 positions shown, 19 images · IV contrast (APPLIED)
Comparison: No priors.

CLINICAL DATA: 25-year-old female with history of tongue cancer.
Former smoker.

EXAM:
CT CHEST WITH CONTRAST
TECHNIQUE: Multidetector CT imaging of the chest was performed during
intravenous contrast administration.
CONTRAST:  75mL 1PO068-2GG IOPAMIDOL (1PO068-2GG) INJECTION 61%

[Series 2: chest w/cm · axial · 0.67mm/px · z∈[-165,+109]mm · 15 of 161 slices shown, 19 images]
[im 12/161  mediastinal]
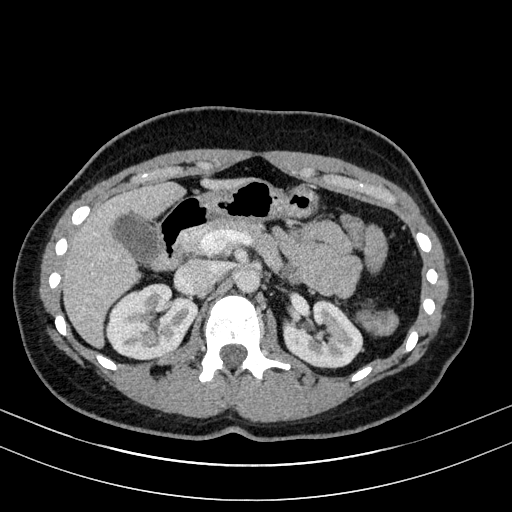
[im 12/161  lung]
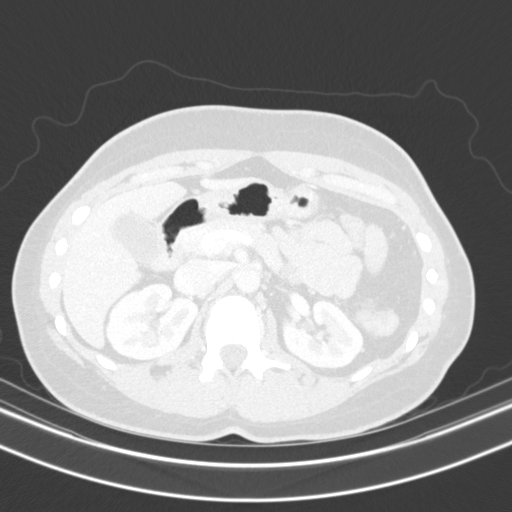
[im 24/161  lung]
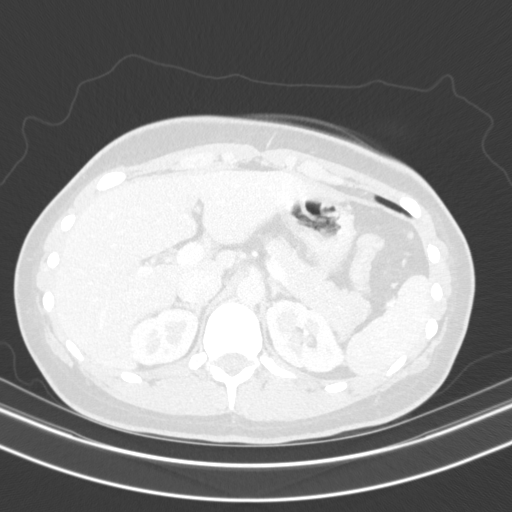
[im 33/161  lung]
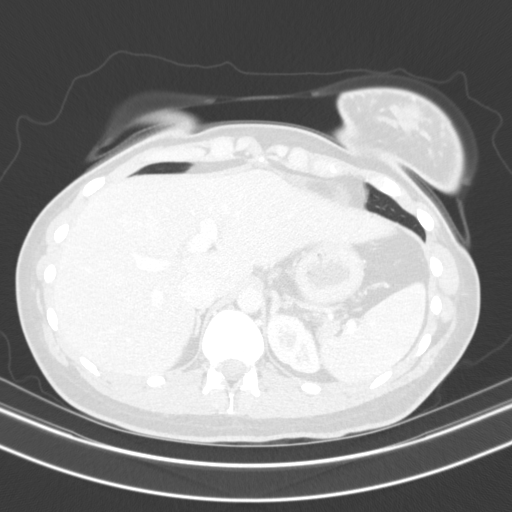
[im 42/161  lung]
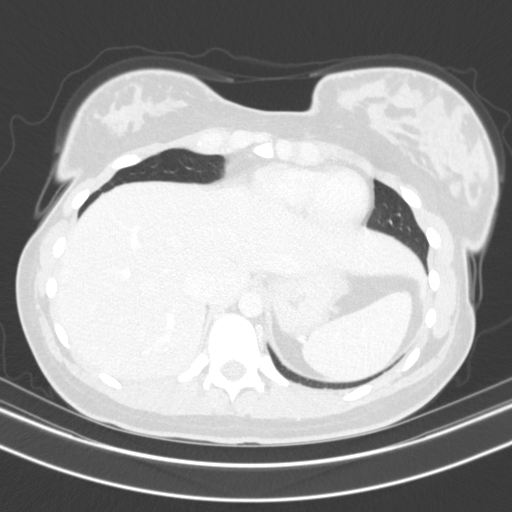
[im 54/161  mediastinal]
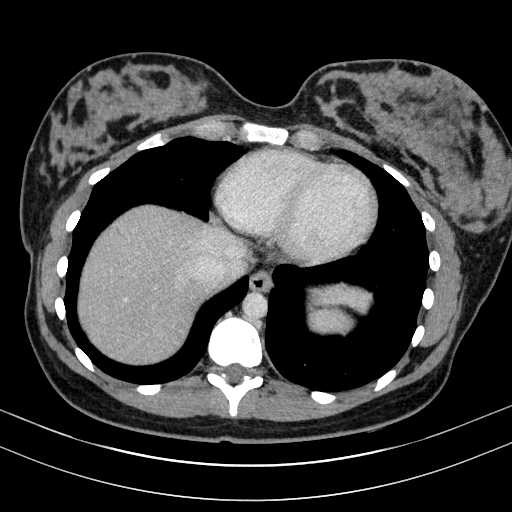
[im 54/161  lung]
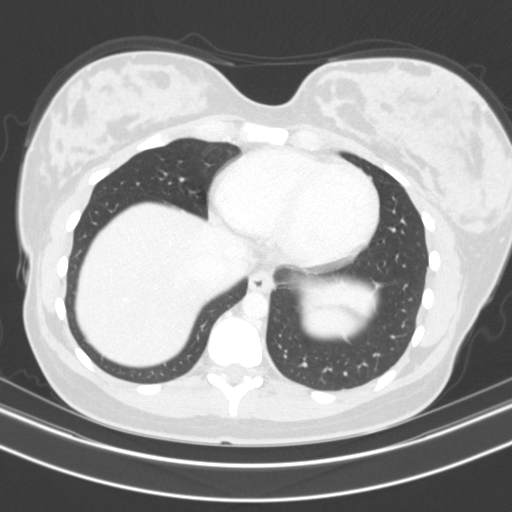
[im 65/161  lung]
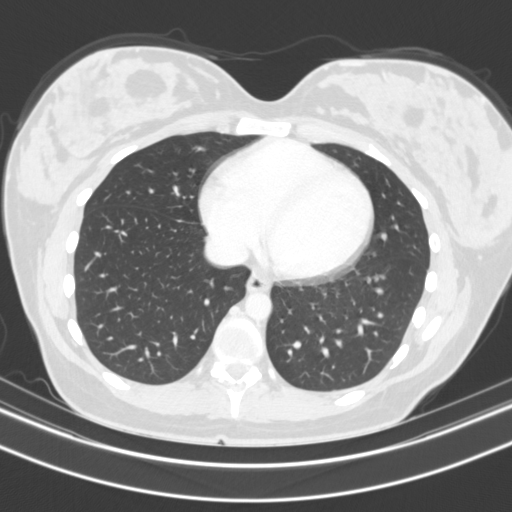
[im 72/161  lung]
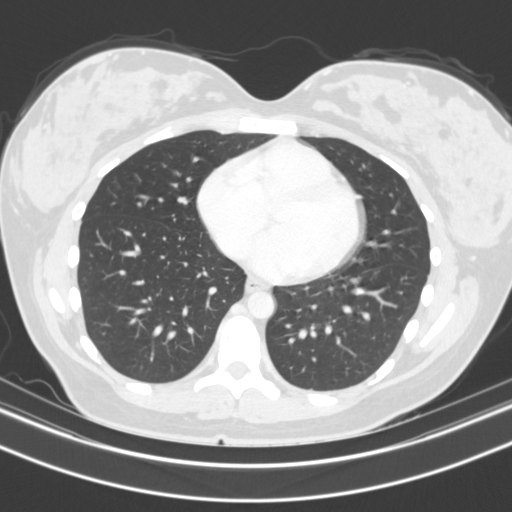
[im 83/161  lung]
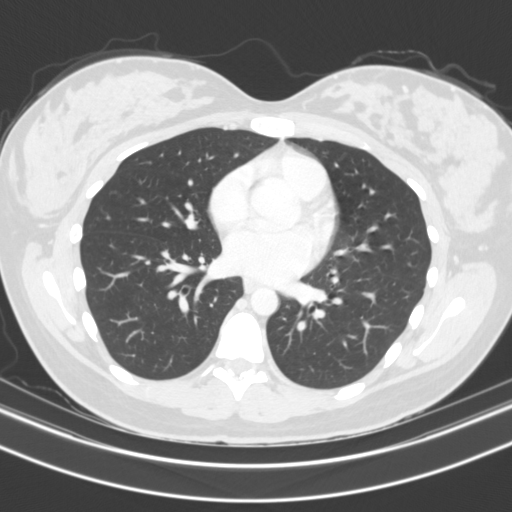
[im 89/161  mediastinal]
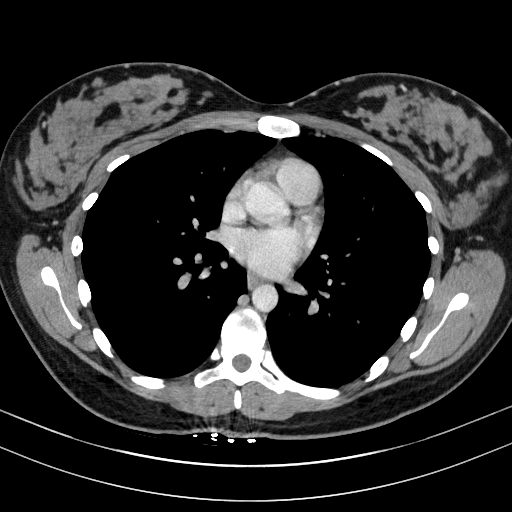
[im 89/161  lung]
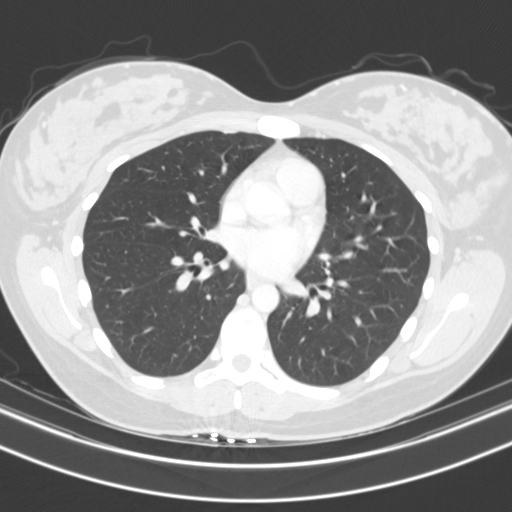
[im 97/161  lung]
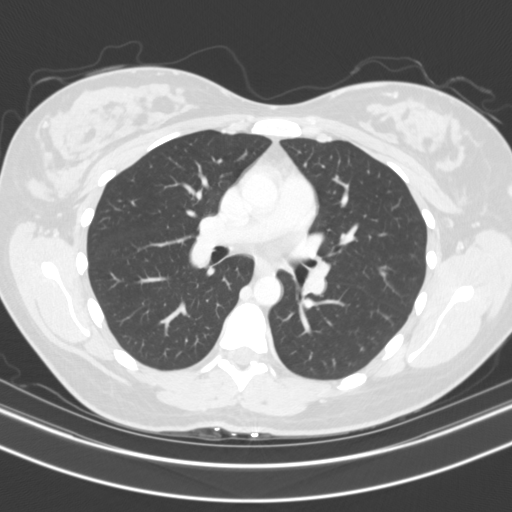
[im 107/161  lung]
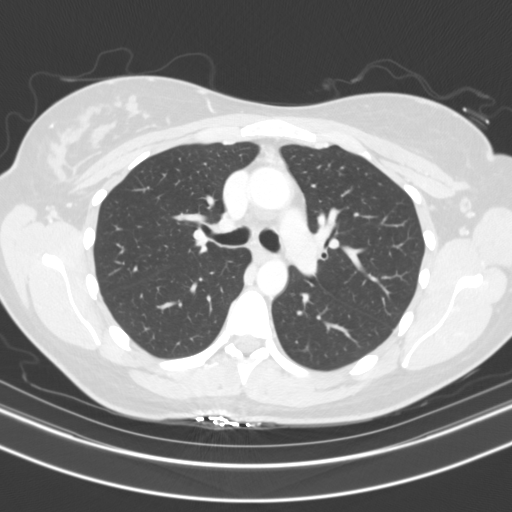
[im 119/161  lung]
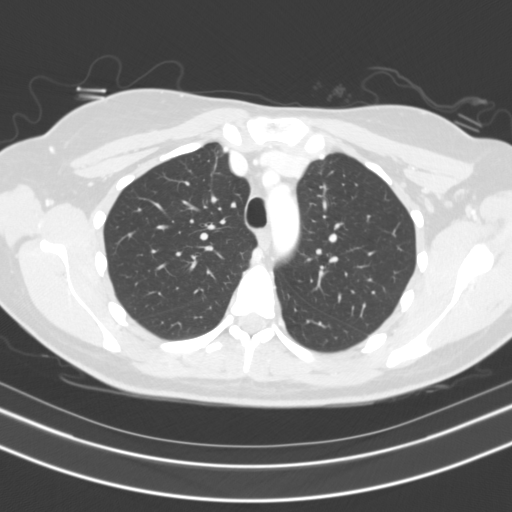
[im 129/161  mediastinal]
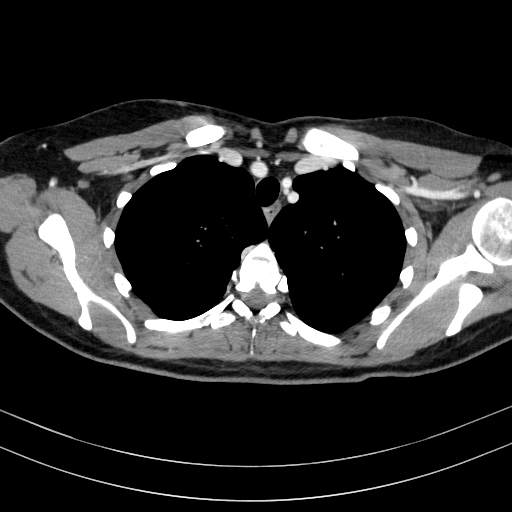
[im 129/161  lung]
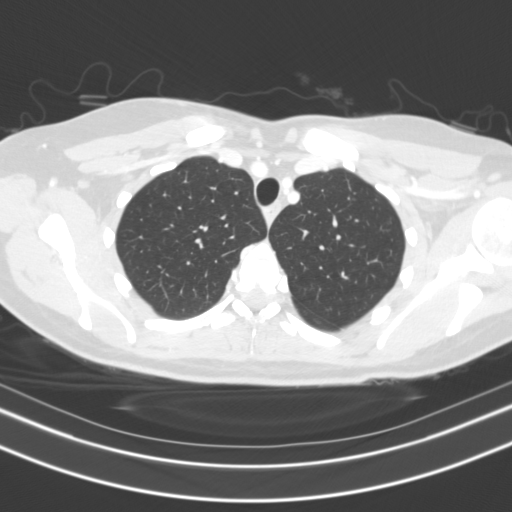
[im 137/161  lung]
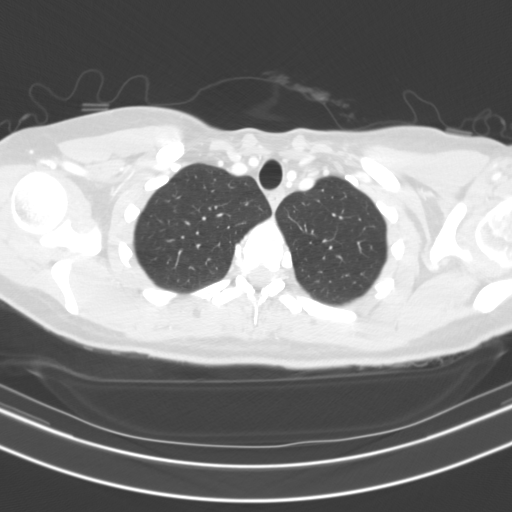
[im 149/161  lung]
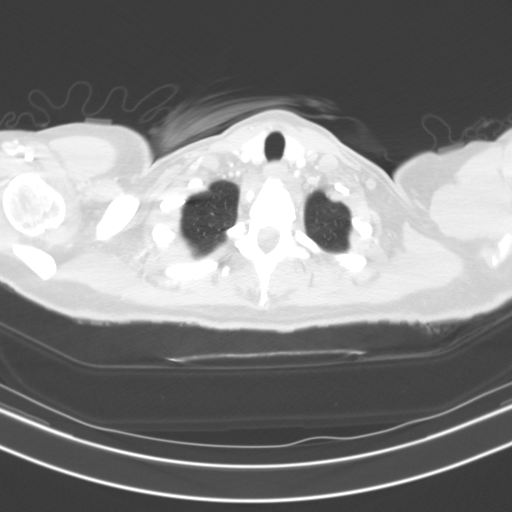

[15 of 34 positions shown; findings below may reference images not displayed]

FINDINGS: Cardiovascular: Heart size is normal. There is no significant
pericardial fluid, thickening or pericardial calcification. No
atherosclerotic calcifications are noted in the thoracic aorta or
the coronary arteries. Separate origin of the left vertebral artery
directly off the aortic arch (normal anatomical variant)
incidentally noted.

Mediastinum/Nodes: No pathologically enlarged mediastinal or hilar
lymph nodes. Esophagus is unremarkable in appearance. No axillary
lymphadenopathy.

Lungs/Pleura: No suspicious appearing pulmonary nodules or masses
are noted. No acute consolidative airspace disease. No pleural
effusions.

Upper Abdomen: Unremarkable.

Musculoskeletal: There are no aggressive appearing lytic or blastic
lesions noted in the visualized portions of the skeleton.
IMPRESSION: 1. No findings to suggest metastatic disease in the thorax.

## 2023-09-25 IMAGING — CT CT NECK W/ CM
4 of 5 series · 14 of 33 positions shown, 16 images · IV contrast (omnipaque)
Comparison: None.

CLINICAL DATA: 25-year-old female recently diagnosed with "tongue
cancer". Currently breast feeding.

EXAM:
CT NECK WITH CONTRAST
TECHNIQUE: Multidetector CT imaging of the neck was performed using the
standard protocol following the bolus administration of intravenous
contrast.
CONTRAST:  75mL OMNIPAQUE IOHEXOL 350 MG/ML SOLN

[Series 4: cor neck · coronal · 0.32mm/px · 3 of 112 slices shown]
[im 23/112  bone]
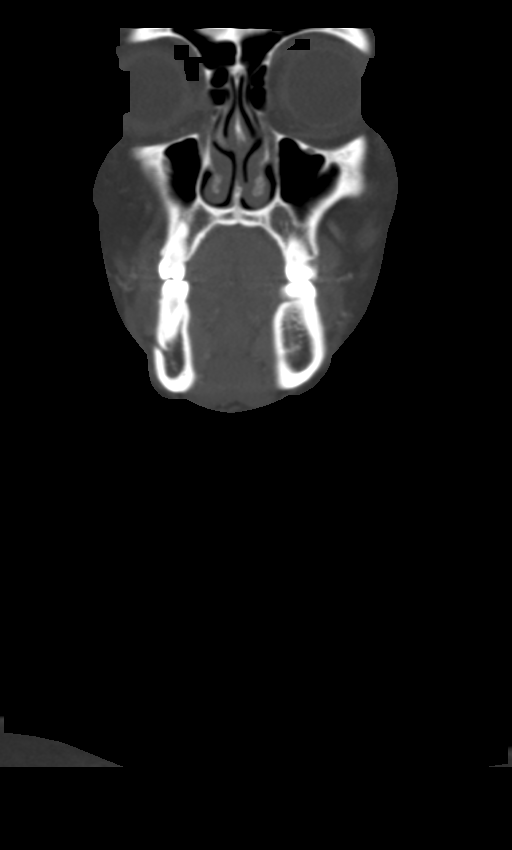
[im 45/112  bone]
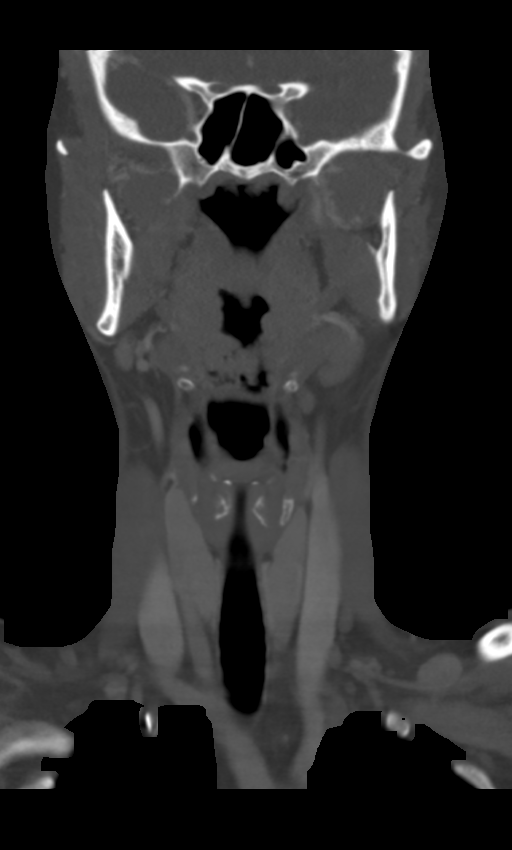
[im 67/112  bone]
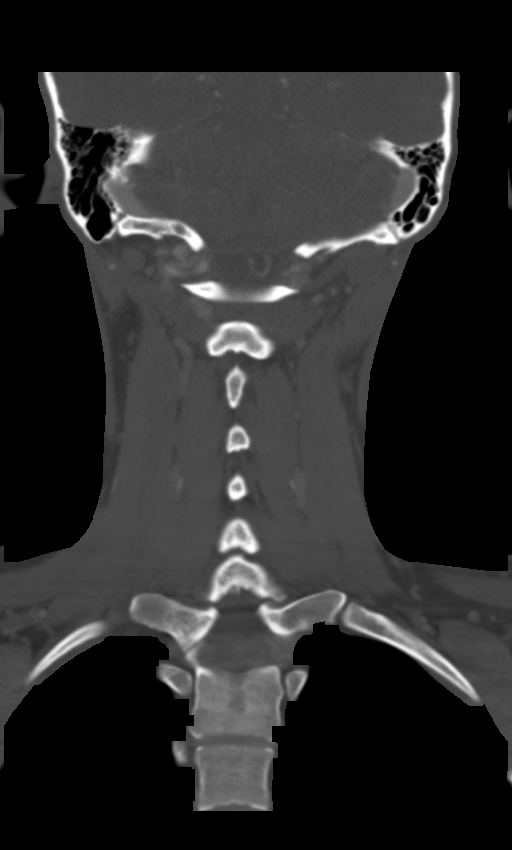

[Series 6: axial bone · axial · 0.52mm/px · z∈[+1234,+1350]mm · 3 of 117 slices shown, 4 images]
[im 30/117  soft-tissue]
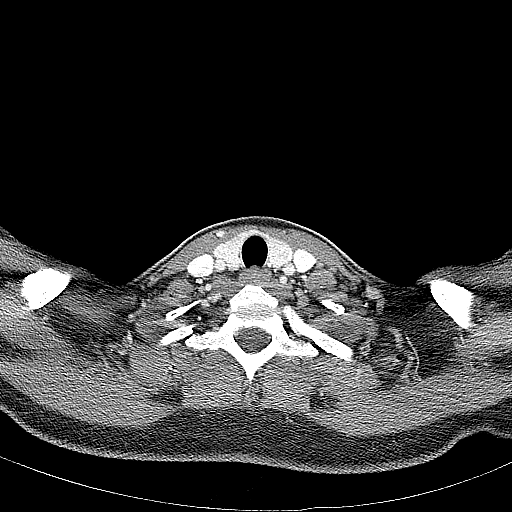
[im 30/117  bone]
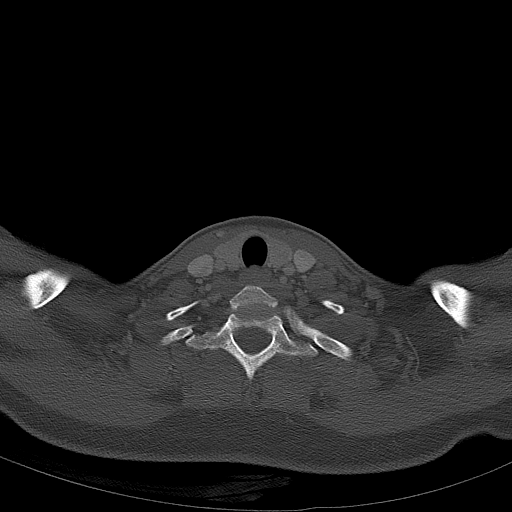
[im 59/117  bone]
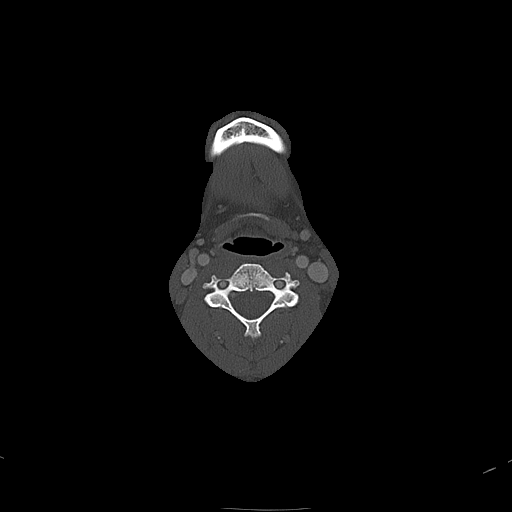
[im 88/117  bone]
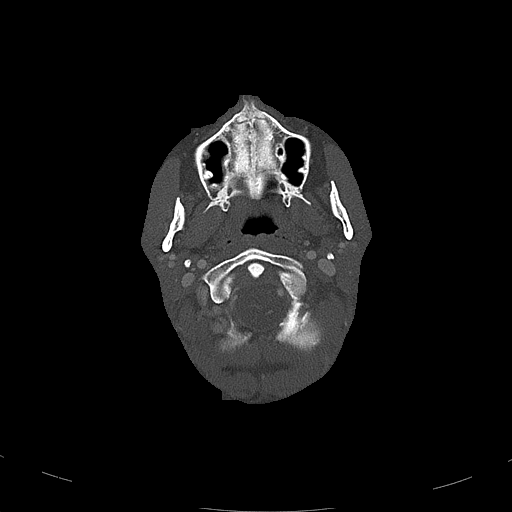

[Series 7: orthogonal (person_name) · axial · 0.39mm/px · z∈[+1211,+1334]mm · 3 of 125 slices shown]
[im 32/125  bone]
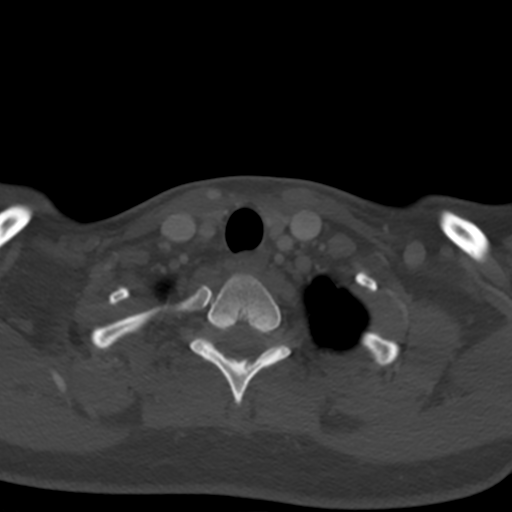
[im 63/125  bone]
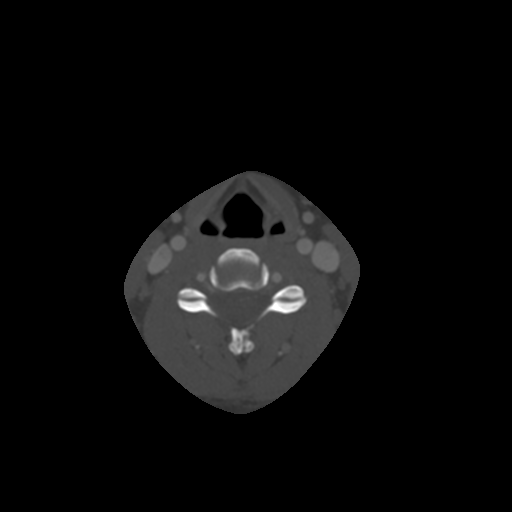
[im 94/125  bone]
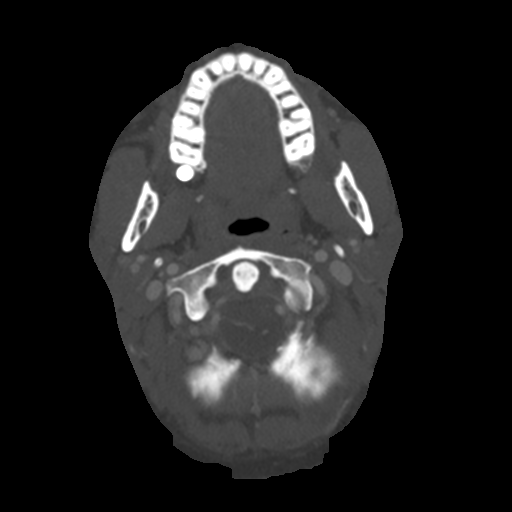

[Series 8: sag neck · sagittal · 0.49mm/px · 5 of 100 slices shown, 6 images]
[im 34/100  bone]
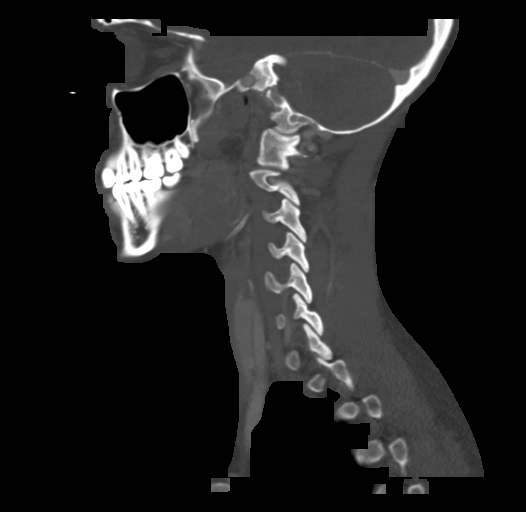
[im 42/100  bone]
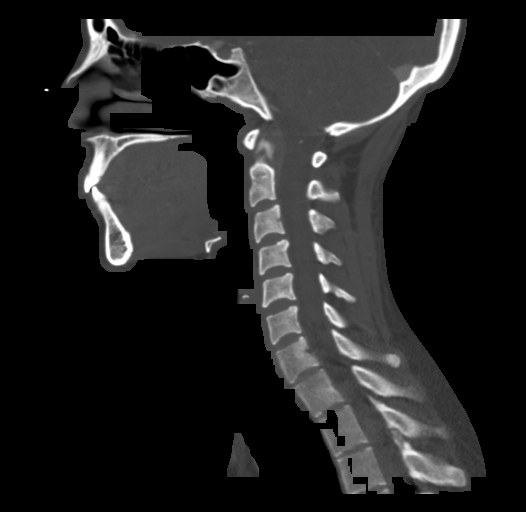
[im 50/100  soft-tissue]
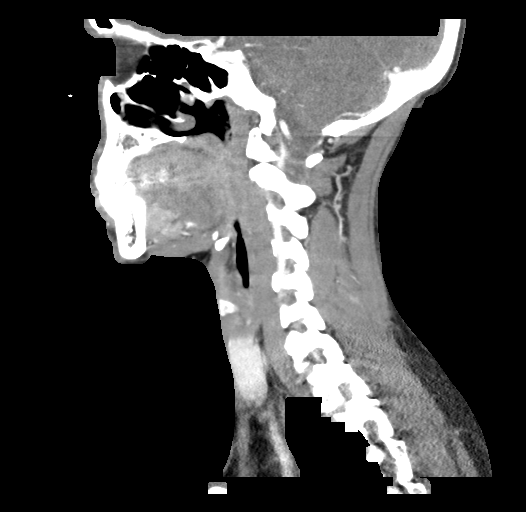
[im 50/100  bone]
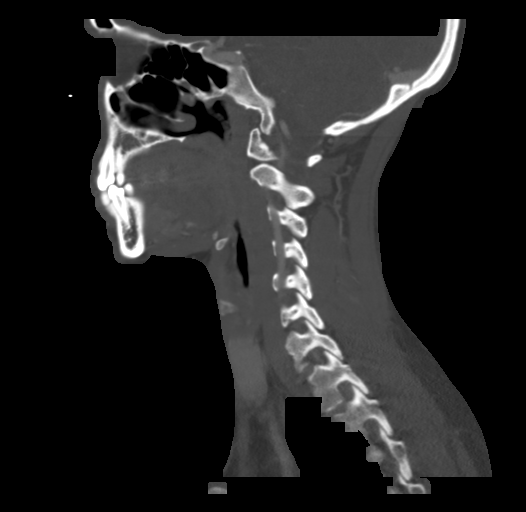
[im 58/100  bone]
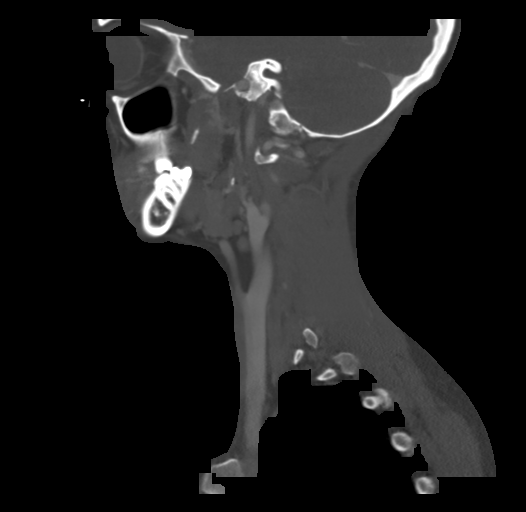
[im 67/100  bone]
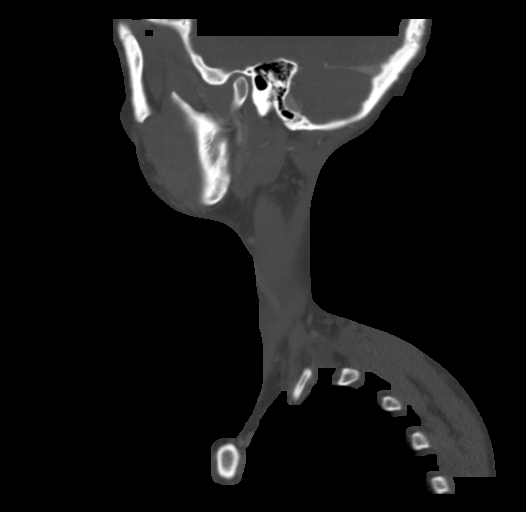

[14 of 33 positions shown; findings below may reference images not displayed]

FINDINGS: Pharynx and larynx: Larynx including the epiglottis appears normal.

Lingual, palatine tonsils and adenoids appear within normal limits.
No pharyngeal mass or mass-like hyperenhancement is identified.

Negative parapharyngeal and retropharyngeal spaces.

No discrete tongue lesion by CT.  Oral cavity appears symmetric.

Salivary glands: The sublingual space and sublingual glands appear
symmetric and within normal limits.

The right submandibular gland is atrophied. But no submandibular
duct enlargement is identified. The left submandibular gland is
within normal limits.

Parotid glands are symmetric and within normal limits.

Thyroid: Negative.

Lymph nodes: Bilateral cervical lymph nodes appear symmetric and
within normal limits. No abnormally enlarged, cystic, or necrotic
lymph nodes.

Vascular: The major vascular structures in the neck and at the skull
base appear patent and normal. Distal left vertebral artery appears
mildly dominant.

Limited intracranial: Negative.

Visualized orbits: Negative.

Mastoids and visualized paranasal sinuses: Clear.

Skeleton: Intact mandible. No acute dental finding. There are
bilateral C7 cervical ribs, normal variant. No acute or suspicious
osseous lesion identified.

Upper chest: Negative.
IMPRESSION: 1. No tongue lesion identified by CT. No neck mass or
lymphadenopathy.
2. Chronic right submandibular gland atrophy.
